# Patient Record
Sex: Female | Born: 1975 | Race: Black or African American | Hispanic: No | Marital: Single | Smoking: Former smoker
Health system: Southern US, Community
[De-identification: ages and names within clinical notes are randomized; demographics above are authoritative.]

## PROBLEM LIST (undated history)

## (undated) DIAGNOSIS — K219 Gastro-esophageal reflux disease without esophagitis: Secondary | ICD-10-CM

## (undated) DIAGNOSIS — I219 Acute myocardial infarction, unspecified: Secondary | ICD-10-CM

## (undated) DIAGNOSIS — I10 Essential (primary) hypertension: Secondary | ICD-10-CM

## (undated) DIAGNOSIS — E669 Obesity, unspecified: Secondary | ICD-10-CM

## (undated) DIAGNOSIS — E785 Hyperlipidemia, unspecified: Secondary | ICD-10-CM

## (undated) DIAGNOSIS — I2510 Atherosclerotic heart disease of native coronary artery without angina pectoris: Secondary | ICD-10-CM

## (undated) HISTORY — DX: Essential (primary) hypertension: I10

## (undated) HISTORY — DX: Hyperlipidemia, unspecified: E78.5

## (undated) HISTORY — DX: Gastro-esophageal reflux disease without esophagitis: K21.9

## (undated) HISTORY — PX: CARDIAC CATHETERIZATION: SHX172

## (undated) HISTORY — DX: Atherosclerotic heart disease of native coronary artery without angina pectoris: I25.10

## (undated) HISTORY — DX: Obesity, unspecified: E66.9

---

## 1998-02-20 ENCOUNTER — Other Ambulatory Visit: Admission: RE | Admit: 1998-02-20 | Discharge: 1998-02-20 | Payer: Self-pay | Admitting: Family Medicine

## 1998-11-01 ENCOUNTER — Encounter: Payer: Self-pay | Admitting: Emergency Medicine

## 1998-11-01 ENCOUNTER — Emergency Department (HOSPITAL_COMMUNITY): Admission: EM | Admit: 1998-11-01 | Discharge: 1998-11-01 | Payer: Self-pay | Admitting: Emergency Medicine

## 2000-06-30 ENCOUNTER — Emergency Department (HOSPITAL_COMMUNITY): Admission: EM | Admit: 2000-06-30 | Discharge: 2000-06-30 | Payer: Self-pay

## 2001-07-29 ENCOUNTER — Emergency Department (HOSPITAL_COMMUNITY): Admission: EM | Admit: 2001-07-29 | Discharge: 2001-07-29 | Payer: Self-pay

## 2002-07-09 ENCOUNTER — Emergency Department (HOSPITAL_COMMUNITY): Admission: EM | Admit: 2002-07-09 | Discharge: 2002-07-10 | Payer: Self-pay | Admitting: Emergency Medicine

## 2002-07-10 ENCOUNTER — Encounter: Payer: Self-pay | Admitting: Emergency Medicine

## 2003-10-25 ENCOUNTER — Emergency Department (HOSPITAL_COMMUNITY): Admission: EM | Admit: 2003-10-25 | Discharge: 2003-10-25 | Payer: Self-pay | Admitting: Emergency Medicine

## 2004-10-23 ENCOUNTER — Emergency Department (HOSPITAL_COMMUNITY): Admission: EM | Admit: 2004-10-23 | Discharge: 2004-10-23 | Payer: Self-pay | Admitting: Emergency Medicine

## 2004-12-10 ENCOUNTER — Ambulatory Visit: Payer: Self-pay | Admitting: Family Medicine

## 2005-01-06 ENCOUNTER — Ambulatory Visit: Payer: Self-pay | Admitting: Family Medicine

## 2005-01-06 ENCOUNTER — Ambulatory Visit: Payer: Self-pay | Admitting: *Deleted

## 2006-05-18 ENCOUNTER — Emergency Department (HOSPITAL_COMMUNITY): Admission: EM | Admit: 2006-05-18 | Discharge: 2006-05-18 | Payer: Self-pay | Admitting: Emergency Medicine

## 2006-05-22 ENCOUNTER — Emergency Department (HOSPITAL_COMMUNITY): Admission: EM | Admit: 2006-05-22 | Discharge: 2006-05-22 | Payer: Self-pay | Admitting: Emergency Medicine

## 2006-06-07 ENCOUNTER — Emergency Department (HOSPITAL_COMMUNITY): Admission: EM | Admit: 2006-06-07 | Discharge: 2006-06-07 | Payer: Self-pay | Admitting: Family Medicine

## 2006-08-10 ENCOUNTER — Emergency Department (HOSPITAL_COMMUNITY): Admission: EM | Admit: 2006-08-10 | Discharge: 2006-08-10 | Payer: Self-pay | Admitting: Emergency Medicine

## 2007-04-19 ENCOUNTER — Emergency Department (HOSPITAL_COMMUNITY): Admission: EM | Admit: 2007-04-19 | Discharge: 2007-04-19 | Payer: Self-pay | Admitting: Family Medicine

## 2008-02-10 ENCOUNTER — Emergency Department (HOSPITAL_COMMUNITY): Admission: EM | Admit: 2008-02-10 | Discharge: 2008-02-10 | Payer: Self-pay | Admitting: Emergency Medicine

## 2008-05-08 ENCOUNTER — Emergency Department (HOSPITAL_COMMUNITY): Admission: EM | Admit: 2008-05-08 | Discharge: 2008-05-08 | Payer: Self-pay | Admitting: Family Medicine

## 2008-06-21 ENCOUNTER — Ambulatory Visit: Payer: Self-pay | Admitting: Cardiology

## 2008-06-21 ENCOUNTER — Inpatient Hospital Stay (HOSPITAL_COMMUNITY): Admission: EM | Admit: 2008-06-21 | Discharge: 2008-06-23 | Payer: Self-pay | Admitting: Emergency Medicine

## 2008-07-06 ENCOUNTER — Ambulatory Visit: Payer: Self-pay | Admitting: Cardiology

## 2008-07-06 ENCOUNTER — Emergency Department (HOSPITAL_COMMUNITY): Admission: EM | Admit: 2008-07-06 | Discharge: 2008-07-06 | Payer: Self-pay | Admitting: Family Medicine

## 2008-07-06 LAB — CONVERTED CEMR LAB
ALT: 10 units/L (ref 0–35)
AST: 13 units/L (ref 0–37)
Albumin: 3.3 g/dL — ABNORMAL LOW (ref 3.5–5.2)
Alkaline Phosphatase: 74 units/L (ref 39–117)
BUN: 3 mg/dL — ABNORMAL LOW (ref 6–23)
Bilirubin, Direct: 0.1 mg/dL (ref 0.0–0.3)
CO2: 29 meq/L (ref 19–32)
Calcium: 9 mg/dL (ref 8.4–10.5)
Chloride: 108 meq/L (ref 96–112)
Cholesterol: 106 mg/dL (ref 0–200)
Creatinine, Ser: 0.6 mg/dL (ref 0.4–1.2)
GFR calc Af Amer: 148 mL/min
GFR calc non Af Amer: 122 mL/min
Glucose, Bld: 73 mg/dL (ref 70–99)
HDL: 27.7 mg/dL — ABNORMAL LOW (ref >39.0)
LDL Cholesterol: 56 mg/dL (ref 0–99)
Potassium: 3.2 meq/L — ABNORMAL LOW (ref 3.5–5.1)
Sodium: 142 meq/L (ref 135–145)
Total Bilirubin: 0.5 mg/dL (ref 0.3–1.2)
Total CHOL/HDL Ratio: 3.8
Total Protein: 7 g/dL (ref 6.0–8.3)
Triglycerides: 113 mg/dL (ref 0–149)
VLDL: 23 mg/dL (ref 0–40)

## 2008-07-14 ENCOUNTER — Ambulatory Visit: Payer: Self-pay | Admitting: Cardiology

## 2008-07-14 LAB — CONVERTED CEMR LAB
BUN: 5 mg/dL — ABNORMAL LOW (ref 6–23)
CO2: 28 meq/L (ref 19–32)
Calcium: 9.4 mg/dL (ref 8.4–10.5)
Chloride: 103 meq/L (ref 96–112)
Creatinine, Ser: 0.7 mg/dL (ref 0.4–1.2)
GFR calc Af Amer: 124 mL/min
GFR calc non Af Amer: 102 mL/min
Glucose, Bld: 76 mg/dL (ref 70–99)
Potassium: 4.1 meq/L (ref 3.5–5.1)
Sodium: 137 meq/L (ref 135–145)

## 2008-08-09 ENCOUNTER — Ambulatory Visit: Payer: Self-pay | Admitting: Cardiology

## 2008-08-09 ENCOUNTER — Emergency Department (HOSPITAL_COMMUNITY): Admission: EM | Admit: 2008-08-09 | Discharge: 2008-08-10 | Payer: Self-pay | Admitting: Emergency Medicine

## 2008-08-16 ENCOUNTER — Ambulatory Visit: Payer: Self-pay | Admitting: Cardiology

## 2008-08-23 ENCOUNTER — Ambulatory Visit: Payer: Self-pay

## 2008-09-01 ENCOUNTER — Ambulatory Visit: Payer: Self-pay | Admitting: Cardiology

## 2008-09-01 LAB — CONVERTED CEMR LAB
BUN: 5 mg/dL — ABNORMAL LOW (ref 6–23)
CO2: 27 meq/L (ref 19–32)
Calcium: 9 mg/dL (ref 8.4–10.5)
Chloride: 105 meq/L (ref 96–112)
Creatinine, Ser: 0.6 mg/dL (ref 0.4–1.2)
GFR calc Af Amer: 148 mL/min
GFR calc non Af Amer: 122 mL/min
Glucose, Bld: 119 mg/dL — ABNORMAL HIGH (ref 70–99)
Potassium: 3.8 meq/L (ref 3.5–5.1)
Sodium: 138 meq/L (ref 135–145)

## 2008-11-08 DIAGNOSIS — R079 Chest pain, unspecified: Secondary | ICD-10-CM | POA: Insufficient documentation

## 2008-11-08 DIAGNOSIS — I10 Essential (primary) hypertension: Secondary | ICD-10-CM | POA: Insufficient documentation

## 2008-11-08 DIAGNOSIS — I2510 Atherosclerotic heart disease of native coronary artery without angina pectoris: Secondary | ICD-10-CM | POA: Insufficient documentation

## 2008-11-08 DIAGNOSIS — K219 Gastro-esophageal reflux disease without esophagitis: Secondary | ICD-10-CM | POA: Insufficient documentation

## 2008-11-08 DIAGNOSIS — E669 Obesity, unspecified: Secondary | ICD-10-CM

## 2008-11-09 ENCOUNTER — Ambulatory Visit: Payer: Self-pay | Admitting: Cardiology

## 2008-11-09 ENCOUNTER — Encounter: Payer: Self-pay | Admitting: Cardiology

## 2008-11-09 DIAGNOSIS — E785 Hyperlipidemia, unspecified: Secondary | ICD-10-CM | POA: Insufficient documentation

## 2009-03-18 ENCOUNTER — Ambulatory Visit: Payer: Self-pay | Admitting: Cardiology

## 2009-03-19 ENCOUNTER — Observation Stay (HOSPITAL_COMMUNITY): Admission: EM | Admit: 2009-03-19 | Discharge: 2009-03-19 | Payer: Self-pay | Admitting: Emergency Medicine

## 2009-03-21 ENCOUNTER — Telehealth (INDEPENDENT_AMBULATORY_CARE_PROVIDER_SITE_OTHER): Payer: Self-pay | Admitting: *Deleted

## 2009-03-22 ENCOUNTER — Ambulatory Visit: Payer: Self-pay

## 2009-03-22 ENCOUNTER — Encounter: Payer: Self-pay | Admitting: Cardiovascular Disease

## 2009-04-06 ENCOUNTER — Ambulatory Visit: Payer: Self-pay | Admitting: Cardiology

## 2009-04-06 DIAGNOSIS — F17200 Nicotine dependence, unspecified, uncomplicated: Secondary | ICD-10-CM | POA: Insufficient documentation

## 2009-07-12 ENCOUNTER — Emergency Department (HOSPITAL_COMMUNITY): Admission: EM | Admit: 2009-07-12 | Discharge: 2009-07-12 | Payer: Self-pay | Admitting: Emergency Medicine

## 2009-07-26 ENCOUNTER — Emergency Department (HOSPITAL_COMMUNITY): Admission: EM | Admit: 2009-07-26 | Discharge: 2009-07-26 | Payer: Self-pay | Admitting: Emergency Medicine

## 2009-08-06 ENCOUNTER — Emergency Department (HOSPITAL_COMMUNITY): Admission: EM | Admit: 2009-08-06 | Discharge: 2009-08-06 | Payer: Self-pay | Admitting: Emergency Medicine

## 2009-11-19 ENCOUNTER — Telehealth (INDEPENDENT_AMBULATORY_CARE_PROVIDER_SITE_OTHER): Payer: Self-pay | Admitting: *Deleted

## 2009-11-24 ENCOUNTER — Emergency Department (HOSPITAL_COMMUNITY): Admission: EM | Admit: 2009-11-24 | Discharge: 2009-11-24 | Payer: Self-pay | Admitting: Emergency Medicine

## 2009-12-11 ENCOUNTER — Ambulatory Visit: Payer: Self-pay | Admitting: Cardiology

## 2010-01-01 ENCOUNTER — Ambulatory Visit: Payer: Self-pay | Admitting: Cardiology

## 2010-01-01 ENCOUNTER — Telehealth: Payer: Self-pay | Admitting: Cardiology

## 2010-01-08 LAB — CONVERTED CEMR LAB
ALT: 17 U/L
AST: 18 U/L
Albumin: 3.9 g/dL
Alkaline Phosphatase: 63 U/L
BUN: 10 mg/dL
Basophils Absolute: 0 K/uL
Basophils Relative: 0.1 %
Bilirubin, Direct: 0.1 mg/dL
CO2: 26 meq/L
Calcium: 9.3 mg/dL
Chloride: 104 meq/L
Cholesterol: 161 mg/dL
Creatinine, Ser: 0.7 mg/dL
Eosinophils Absolute: 0.1 K/uL
Eosinophils Relative: 0.9 %
GFR calc non Af Amer: 122.43 mL/min
Glucose, Bld: 76 mg/dL
HCT: 34.6 % — ABNORMAL LOW
HDL: 47.9 mg/dL
Hemoglobin: 12.1 g/dL
LDL Cholesterol: 90 mg/dL
Lymphocytes Relative: 26.8 %
Lymphs Abs: 3.3 K/uL
MCHC: 35 g/dL
MCV: 95.4 fL
Monocytes Absolute: 0.8 K/uL
Monocytes Relative: 6.6 %
Neutro Abs: 8.1 K/uL — ABNORMAL HIGH
Neutrophils Relative %: 65.6 %
Platelets: 407 K/uL — ABNORMAL HIGH
Potassium: 3.6 meq/L
RBC: 3.62 M/uL — ABNORMAL LOW
RDW: 12.4 %
Sodium: 139 meq/L
Total Bilirubin: 0.4 mg/dL
Total CHOL/HDL Ratio: 3
Total Protein: 6.9 g/dL
Triglycerides: 114 mg/dL
VLDL: 22.8 mg/dL
WBC: 12.3 10*3/microliter — ABNORMAL HIGH

## 2010-01-28 ENCOUNTER — Emergency Department (HOSPITAL_COMMUNITY): Admission: EM | Admit: 2010-01-28 | Discharge: 2010-01-28 | Payer: Self-pay | Admitting: Emergency Medicine

## 2010-02-16 ENCOUNTER — Emergency Department (HOSPITAL_COMMUNITY): Admission: EM | Admit: 2010-02-16 | Discharge: 2010-02-16 | Payer: Self-pay | Admitting: Emergency Medicine

## 2010-04-05 ENCOUNTER — Encounter (INDEPENDENT_AMBULATORY_CARE_PROVIDER_SITE_OTHER): Payer: Self-pay | Admitting: *Deleted

## 2010-04-05 ENCOUNTER — Telehealth: Payer: Self-pay | Admitting: Cardiology

## 2010-04-15 ENCOUNTER — Telehealth: Payer: Self-pay | Admitting: Cardiology

## 2010-04-22 ENCOUNTER — Telehealth: Payer: Self-pay | Admitting: Cardiology

## 2010-05-21 ENCOUNTER — Telehealth: Payer: Self-pay | Admitting: Cardiology

## 2010-05-21 ENCOUNTER — Encounter (INDEPENDENT_AMBULATORY_CARE_PROVIDER_SITE_OTHER): Payer: Self-pay | Admitting: *Deleted

## 2010-08-01 ENCOUNTER — Emergency Department (HOSPITAL_COMMUNITY)
Admission: EM | Admit: 2010-08-01 | Discharge: 2010-08-01 | Payer: Self-pay | Source: Home / Self Care | Admitting: Emergency Medicine

## 2010-08-16 ENCOUNTER — Emergency Department (HOSPITAL_COMMUNITY)
Admission: EM | Admit: 2010-08-16 | Discharge: 2010-08-16 | Payer: Self-pay | Source: Home / Self Care | Admitting: Emergency Medicine

## 2010-08-19 LAB — DIFFERENTIAL
Basophils Absolute: 0 10*3/uL (ref 0.0–0.1)
Basophils Relative: 0 % (ref 0–1)
Eosinophils Absolute: 0.1 10*3/uL (ref 0.0–0.7)
Eosinophils Relative: 1 % (ref 0–5)
Lymphocytes Relative: 21 % (ref 12–46)
Lymphs Abs: 2.6 10*3/uL (ref 0.7–4.0)
Monocytes Absolute: 0.5 10*3/uL (ref 0.1–1.0)
Monocytes Relative: 4 % (ref 3–12)
Neutro Abs: 9.2 10*3/uL — ABNORMAL HIGH (ref 1.7–7.7)
Neutrophils Relative %: 74 % (ref 43–77)

## 2010-08-19 LAB — CBC
HCT: 38.6 % (ref 36.0–46.0)
Hemoglobin: 12.9 g/dL (ref 12.0–15.0)
MCH: 31.9 pg (ref 26.0–34.0)
MCHC: 33.4 g/dL (ref 30.0–36.0)
MCV: 95.5 fL (ref 78.0–100.0)
Platelets: 359 10*3/uL (ref 150–400)
RBC: 4.04 MIL/uL (ref 3.87–5.11)
RDW: 12.8 % (ref 11.5–15.5)
WBC: 12.3 10*3/uL — ABNORMAL HIGH (ref 4.0–10.5)

## 2010-08-19 LAB — POCT CARDIAC MARKERS
CKMB, poc: <1 ng/mL — ABNORMAL LOW (ref 1.0–8.0)
Myoglobin, poc: 58.3 ng/mL (ref 12–200)
Troponin i, poc: <0.05 ng/mL (ref 0.00–0.09)

## 2010-08-19 LAB — BASIC METABOLIC PANEL
BUN: 7 mg/dL (ref 6–23)
CO2: 21 mEq/L (ref 19–32)
Calcium: 9.2 mg/dL (ref 8.4–10.5)
Chloride: 109 mEq/L (ref 96–112)
Creatinine, Ser: 0.76 mg/dL (ref 0.4–1.2)
GFR calc Af Amer: >60 mL/min (ref >60)
GFR calc non Af Amer: >60 mL/min (ref >60)
Glucose, Bld: 140 mg/dL — ABNORMAL HIGH (ref 70–99)
Potassium: 4.4 mEq/L (ref 3.5–5.1)
Sodium: 140 mEq/L (ref 135–145)

## 2010-08-19 LAB — URINALYSIS, ROUTINE W REFLEX MICROSCOPIC
Bilirubin Urine: NEGATIVE
Hgb urine dipstick: NEGATIVE
Ketones, ur: NEGATIVE mg/dL
Nitrite: NEGATIVE
Protein, ur: NEGATIVE mg/dL
Specific Gravity, Urine: 1.005 (ref 1.005–1.030)
Urine Glucose, Fasting: NEGATIVE mg/dL
Urobilinogen, UA: 0.2 mg/dL (ref 0.0–1.0)
pH: 6.5 (ref 5.0–8.0)

## 2010-08-19 LAB — OCCULT BLOOD, POC DEVICE: Fecal Occult Bld: NEGATIVE

## 2010-08-19 LAB — ACETAMINOPHEN LEVEL: Acetaminophen (Tylenol), Serum: <10 ug/mL — ABNORMAL LOW (ref 10–30)

## 2010-08-19 LAB — GLUCOSE, CAPILLARY: Glucose-Capillary: 137 mg/dL — ABNORMAL HIGH (ref 70–99)

## 2010-08-19 LAB — POCT PREGNANCY, URINE: Preg Test, Ur: NEGATIVE

## 2010-08-19 LAB — SALICYLATE LEVEL: Salicylate Lvl: <4 mg/dL (ref 2.8–20.0)

## 2010-09-03 NOTE — Assessment & Plan Note (Signed)
Summary: [id number]   Visit Type:  6 mo f/u Primary Provider:  DYork Ram CC:  sob...denies any cp or edema.  History of Present Illness: The patient is 35years old and return for management of CAD. In November of 2009 she had a non-ST elevation MI treated with drug-eluting stents to the circumflex and right coronary arteries by Dr. 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 She was evaluated for chest pain in August of 2010 with a Myoview which showed inferior ischemia which was felt to be due to her total occlusion of the distal circumflex artery. She has done quite well since that time has had no recent chest pain shortness of breath or palpitations.  Further problems include hypertension and excess weight. She used to be a smoker but she has now stopped smoking.  Her biggest problem is related to low back pain and lumbar disc disease. She is being treated by her primary care physician for this.  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  She came in today with her partner.  Current Medications (verified): 1)  Aspirin Ec 325 Mg Tbec (Aspirin) .... Take One Tablet By Mouth Daily 2)  Ventolin Hfa 108 (90 Base) Mcg/act Aers (Albuterol Sulfate) .... As Needed 3)  Simvastatin 40 Mg Tabs (Simvastatin) .... Take One Tablet By Mouth Daily At Bedtime 4)  Plavix 75 Mg Tabs (Clopidogrel Bisulfate) .... Take One Tablet By Mouth Daily 5)  Multivitamins  Caps (Multiple Vitamin) .... Take 1 By Mouth Once Daily 6)  Amlodipine Besylate 10 Mg Tabs (Amlodipine Besylate) .... Take One Tablet By Mouth Daily 7)  Lisinopril 40 Mg Tabs (Lisinopril) ..Marland Kitchen. 1 Tablet By Mouth Once A Day 8)  Metoprolol Tartrate 50 Mg Tabs (Metoprolol Tartrate) .... Take One Tablet By Mouth Twice A Day  Allergies (verified): No Known Drug Allergies  Past History:  Past Medical History: Reviewed history from 11/08/2008 and no changes required.  1. Coronary artery disease and NSTEMI 2009 (status post PCI with overlapping 2.5 x 18       mm and 2.75 x 12 mm Promus drug-eluting stent to the OM 1; and a       3.5 x 28 mm Promus to the mid RCA).   2. Hypertension.   3. GERD.   4. Obesity.   Review of Systems       ROS is negative except as outlined in HPI.   Vital Signs:  Patient profile:   35year old female Height:      62 inches Weight:      218 pounds BMI:     40.02 Pulse rate:   69 / minute Pulse rhythm:   regular BP sitting:   122 / 80  (left arm) Cuff size:   regular  Vitals Entered By: DLubertha Basque CNA (Dec 11, 2009 4:33 PM)  Physical Exam  Additional Exam:  Gen. Well-nourished, in no distress   Neck: No JVD, thyroid not enlarged, no carotid bruits Lungs: No tachypnea, clear without rales, rhonchi or wheezes Cardiovascular: Rhythm regular, PMI not displaced,  heart sounds  normal, no murmurs or gallops, no peripheral edema, pulses normal in all 4 extremities. Abdomen: BS normal, abdomen soft and non-tender without masses or organomegaly, no hepatosplenomegaly. MS: No deformities, no cyanosis or clubbing   Neuro:  No focal sns   Skin:  no lesions    Impression & Recommendations:  Problem #  1:  CAD, UNSPECIFIED SITE (ICD-414.00)  She had a previous non-ST elevation MI and PCI procedure as described in the history of present illness. She's had no recent chest pain and this problem appears stable. Her updated medication list for this problem includes:    Aspirin Ec 325 Mg Tbec (Aspirin) .Marland Kitchen... Take one tablet by mouth daily    Plavix 75 Mg Tabs (Clopidogrel bisulfate) .Marland Kitchen... Take one tablet by mouth daily    Amlodipine Besylate 10 Mg Tabs (Amlodipine besylate) .Marland Kitchen... Take one tablet by mouth daily    Lisinopril 40 Mg Tabs (Lisinopril) .Marland Kitchen... 1 tablet by mouth once a day    Metoprolol Tartrate 50 Mg Tabs (Metoprolol tartrate) .Marland Kitchen... Take one tablet by mouth twice a day  Orders: EKG w/ Interpretation (93000)  Her  updated medication list for this problem includes:    Aspirin Ec 325 Mg Tbec (Aspirin) .Marland Kitchen... Take one tablet by mouth daily    Plavix 75 Mg Tabs (Clopidogrel bisulfate) .Marland Kitchen... Take one tablet by mouth daily    Amlodipine Besylate 10 Mg Tabs (Amlodipine besylate) .Marland Kitchen... Take one tablet by mouth daily    Lisinopril 40 Mg Tabs (Lisinopril) .Marland Kitchen... 1 tablet by mouth once a day    Metoprolol Tartrate 50 Mg Tabs (Metoprolol tartrate) .Marland Kitchen... Take one tablet by mouth twice a day  Problem # 2:  HYPERLIPIDEMIA-MIXED (ICD-272.4)  This is managed with simvastatin. She has not had a lipid profile in more than a year and we will plan to get that. Her updated medication list for this problem includes:    Simvastatin 40 Mg Tabs (Simvastatin) .Marland Kitchen... Take one tablet by mouth daily at bedtime  Her updated medication list for this problem includes:    Simvastatin 40 Mg Tabs (Simvastatin) .Marland Kitchen... Take one tablet by mouth daily at bedtime  Problem # 3:  HYPERTENSION, UNSPECIFIED (ICD-401.9)  This is well controlled on current medications. Her updated medication list for this problem includes:    Aspirin Ec 325 Mg Tbec (Aspirin) .Marland Kitchen... Take one tablet by mouth daily    Amlodipine Besylate 10 Mg Tabs (Amlodipine besylate) .Marland Kitchen... Take one tablet by mouth daily    Lisinopril 40 Mg Tabs (Lisinopril) .Marland Kitchen... 1 tablet by mouth once a day    Metoprolol Tartrate 50 Mg Tabs (Metoprolol tartrate) .Marland Kitchen... Take one tablet by mouth twice a day  Her updated medication list for this problem includes:    Aspirin Ec 325 Mg Tbec (Aspirin) .Marland Kitchen... Take one tablet by mouth daily    Amlodipine Besylate 10 Mg Tabs (Amlodipine besylate) .Marland Kitchen... Take one tablet by mouth daily    Lisinopril 40 Mg Tabs (Lisinopril) .Marland Kitchen... 1 tablet by mouth once a day    Metoprolol Tartrate 50 Mg Tabs (Metoprolol tartrate) .Marland Kitchen... Take one tablet by mouth twice a day  Problem # 4:  TOBACCO ABUSE (ICD-305.1) She is no longer smoking. She has gained weight because of this  and I encouraged her to stop that.  Patient Instructions: 1)  Your physician recommends that you schedule a follow-up appointment in: [place] 2)  Your physician recommends that you return for a FASTING lipid profile and CBC,BMP,LIVER 414.01, 272.2 3)  Your physician recommends that you continue on your current medications as directed. Please refer to the Current Medication list given to you today. 4)  Your physician recommends a low cholesterol, low fat diet. Please see [facility] handout.

## 2010-09-03 NOTE — Letter (Signed)
Summary: Generic Letter  Press photographer, [place]  [address]   [place], [place] [address]   Phone: [phone]  Fax: [phone]        May 21, 2010 MRN: [id number]    [place], [place]  [address]    To Whom It May Concern,  The above named patient is currently followed in our cardiology practice for cornary artery disease. Her medications include- aspirin 348m once daily, simvastatin 222monce daily, plavix 7561mnce daily, amlodipine 24m73mce daily, lisinopril 40mg33me daily, and metoprolol tartrate 50mg 100mtimes a day. Due to her cardiac history, she will need to remain on these medications indefinitely.      Sincerely,   Bruce Eustace QuaileatheAlvis LemmingsBSN

## 2010-09-03 NOTE — Progress Notes (Signed)
Summary: Need note for work  Phone Note Call from Patient Call back at [employer] [phone]   Caller: Patient Summary of Call: Pt calling to get a notesfor work Initial call taken by: Delsa Sale,  Jan 01, 2010 3:00 PM  Follow-up for Phone Call        needs note stating she was here today, she will pick it up.

## 2010-09-03 NOTE — Progress Notes (Signed)
Summary: refill** [facility] [address]**  Phone Note Refill Request   Refills Requested: Medication #1:  PLAVIX 75 MG TABS Take one tablet by mouth daily  Medication #2:  LISINOPRIL 40 MG TABS 1 tablet by mouth once a day  Medication #3:  AMLODIPINE BESYLATE 10 MG TABS Take one tablet by mouth daily  Medication #4:  METOPROLOL TARTRATE 50 MG TABS Take one tablet by mouth twice a day. [facility] [address]   Method Requested: Telephone to Pharmacy Initial call taken by: Darnell Level,  November 19, 2009 9:04 AM  Follow-up for Phone Call        Rx faxed to [facility] Follow-up by: Lynden Ang,  November 19, 2009 3:37 PM    Prescriptions: METOPROLOL TARTRATE 50 MG TABS (METOPROLOL TARTRATE) Take one tablet by mouth twice a day  #60 Each x 5   Entered by:   Lynden Ang   Authorized by:   Fatima Sanger, MD, [facility]   Signed by:   Lynden Ang on 11/19/2009   Method used:   Electronically to        [facility] (retail)       [phone]       [place], [place]  [address]       Ph: [phone]       Fax: [phone]   RxID:   [id number] LISINOPRIL 40 MG TABS (LISINOPRIL) 1 tablet by mouth once a day  #30 x 6   Entered by:   Lynden Ang   Authorized by:   Fatima Sanger, MD, [facility]   Signed by:   Lynden Ang on 11/19/2009   Method used:   Electronically to        [facility] (retail)       [phone]       [place], [place]  [address]       Ph: [phone]       Fax: [phone]   RxID:   [id number] AMLODIPINE BESYLATE 10 MG TABS (AMLODIPINE BESYLATE) Take one tablet by mouth daily  #30 x 6   Entered by:   Lynden Ang   Authorized by:   Fatima Sanger, MD, [facility]   Signed by:   Lynden Ang on 11/19/2009   Method used:   Electronically to        [facility] (retail)     [phone]       [place], [place]  [address]       Ph: [phone]       Fax: [phone]   RxID:   [id number] PLAVIX 75 MG TABS (CLOPIDOGREL BISULFATE) Take one tablet by mouth daily  #30 x 6   Entered by:   Lynden Ang   Authorized by:   Fatima Sanger, MD, [facility]   Signed by:   Lynden Ang on 11/19/2009   Method used:   Electronically to        [facility] (retail)       [phone]       [place], [place]  [address]       Ph: [phone]       Fax: [phone]   RxID:   [id number]

## 2010-09-03 NOTE — Letter (Signed)
Summary: Generic Letter  Press photographer, [place]  [address] [place]   [place], [place] [address]   Phone: [phone]  Fax: [phone]        April 05, 2010 MRN: [id number]    [place],   [address]    Dear Ms. Rabadi,  I have tried to reach you by phone, but the home number we have listed for you has stated it was invalid. I was needing to speak with you in regards to new FDA guidelines that are out regarding simvastatin and a possible interaction with amlodipine. Please call me as soon as you receive this letter, at [phone].     Sincerely,  Alvis Lemmings, RN, BSN  This letter has been electronically signed by your physician.

## 2010-09-03 NOTE — Progress Notes (Signed)
Summary: requesting  generics  Phone Note Call from Patient   Caller: Patient Reason for Call: Talk to Nurse Summary of Call: plavix and amlodipine have gone up-pt requesting generics-[facility] [address] # [phone] Initial call taken by: Lorenda Hatchet,  April 22, 2010 1:34 PM  Follow-up for Phone Call        notified pt that plavix not generic. ordered meds Joan Flores, RN, BSN    New/Updated Medications: PLAVIX 75 MG TABS (CLOPIDOGREL BISULFATE) Take one tablet by mouth daily AMLODIPINE BESYLATE 10 MG TABS (AMLODIPINE BESYLATE) once daily Prescriptions: PLAVIX 75 MG TABS (CLOPIDOGREL BISULFATE) Take one tablet by mouth daily  #30 x 11   Entered by:   Joan Flores RN   Authorized by:   Fatima Sanger, MD, [facility]   Signed by:   Joan Flores RN on 04/22/2010   Method used:   Electronically to        [facility] (retail)       [phone]       [place], [place]  [address]       Ph: [phone]       Fax: [phone]   RxID:   [id number] AMLODIPINE BESYLATE 10 MG TABS (AMLODIPINE BESYLATE) once daily  #30 x 11   Entered by:   Joan Flores RN   Authorized by:   Fatima Sanger, MD, [facility]   Signed by:   Joan Flores RN on 04/22/2010   Method used:   Electronically to        [facility].* (retail)       [phone]       [place], [place]  [address]       Ph: [phone]       Fax: [phone]   RxID:   [id number]

## 2010-09-03 NOTE — Progress Notes (Signed)
Summary: returning call from letter  Phone Note Call from Patient   Caller: Patient Reason for Call: Talk to Nurse Summary of Call: pt returning call to [facility] from letter she sent re med-pls call [phone] Initial call taken by: Lorenda Hatchet,  April 15, 2010 2:10 PM  Follow-up for Phone Call        pt has been out of her medications for 3 weeks.  We will call in a new Rx for Simvastation 65m at bedtime.  She verbalized understanding.  Her new # is [phone] KJanan Halter RN, BSN  April 15, 2010 2:54 PM    New/Updated Medications: SIMVASTATIN 20 MG TABS (SIMVASTATIN) one by mouth qhs Prescriptions: SIMVASTATIN 20 MG TABS (SIMVASTATIN) one by mouth qhs  #30 x 6   Entered by:   KJanan Halter RN, BSN   Authorized by:   BFatima Sanger MD, [facility]  Signed by:   KJanan Halter RN, BSN on 04/15/2010   Method used:   Electronically to        [facility]* (retail)       [phone]       [place]  [address]      Ph: [phone]      Fax: [phone]  RxID:   [id number]

## 2010-09-03 NOTE — Letter (Signed)
Summary: Work Herbalist, [place]  [address] [place]   [place],  [address]   Phone: [phone]  Fax: [phone]         Jan 01, 2010      [facility]     The above named patient had a medical visit today.  Please take this into consideration when reviewing the time away from work/school.        Sincerely yours,     Vic Ripper, RN for Dr. Eustace Quail [facility]

## 2010-09-03 NOTE — Progress Notes (Signed)
Summary: needs letter   Phone Note Call from Patient   Caller: Patient Reason for Call: Talk to Nurse Summary of Call: need a list of meds, with a letter stating she will be on these indefinitely-pls call when ready [phone] will pick up, trying to get assistance wit her meds Initial call taken by: Lorenda Hatchet,  May 21, 2010 10:28 AM  Follow-up for Phone Call        I will review with Dr. Olevia Perches and ask that he do a letter. Alvis Lemmings, RN, BSN  May 21, 2010 11:14 AM   Letter done. Patient aware.  Follow-up by: Alvis Lemmings, RN, BSN,  May 21, 2010 6:00 PM

## 2010-09-03 NOTE — Progress Notes (Signed)
Summary: Med change  ---- Converted from flag ---- ---- 04/01/2010 8:47 PM, Fatima Sanger, MD, [facility] wrote: Change to 20. BB  ---- 03/28/2010 5:19 PM, Alvis Lemmings, RN, BSN wrote: notification from [facility]- pt on simvastatin 72m and amlodipine. Max on simvastatin should be 262mwith amlodipine. Please advise. Thanks. ------------------------------  Phone Note Outgoing Call   Call placed by: HeAlvis LemmingsRN, BSN,  April 05, 2010 2:00 PM Call placed to: Patient Summary of Call: I attempted to call the pt at her home #- stated # was invalid. I attempted to call her work #- voice mail was identified as another person. Letter mailed to pt asking her to call so we can decrease simvastatin due to interaction with amlodipine.  Initial call taken by: HeAlvis LemmingsRN, BSN,  April 05, 2010 2:01 PM

## 2010-09-21 IMAGING — CR DG CHEST 2V
2 series · 2 of 2 positions shown · non-contrast
Comparison: Chest 07/12/2009.

CLINICAL DATA: Chest pain shortness of breath.

CHEST - 2 VIEW

[w chest pa]
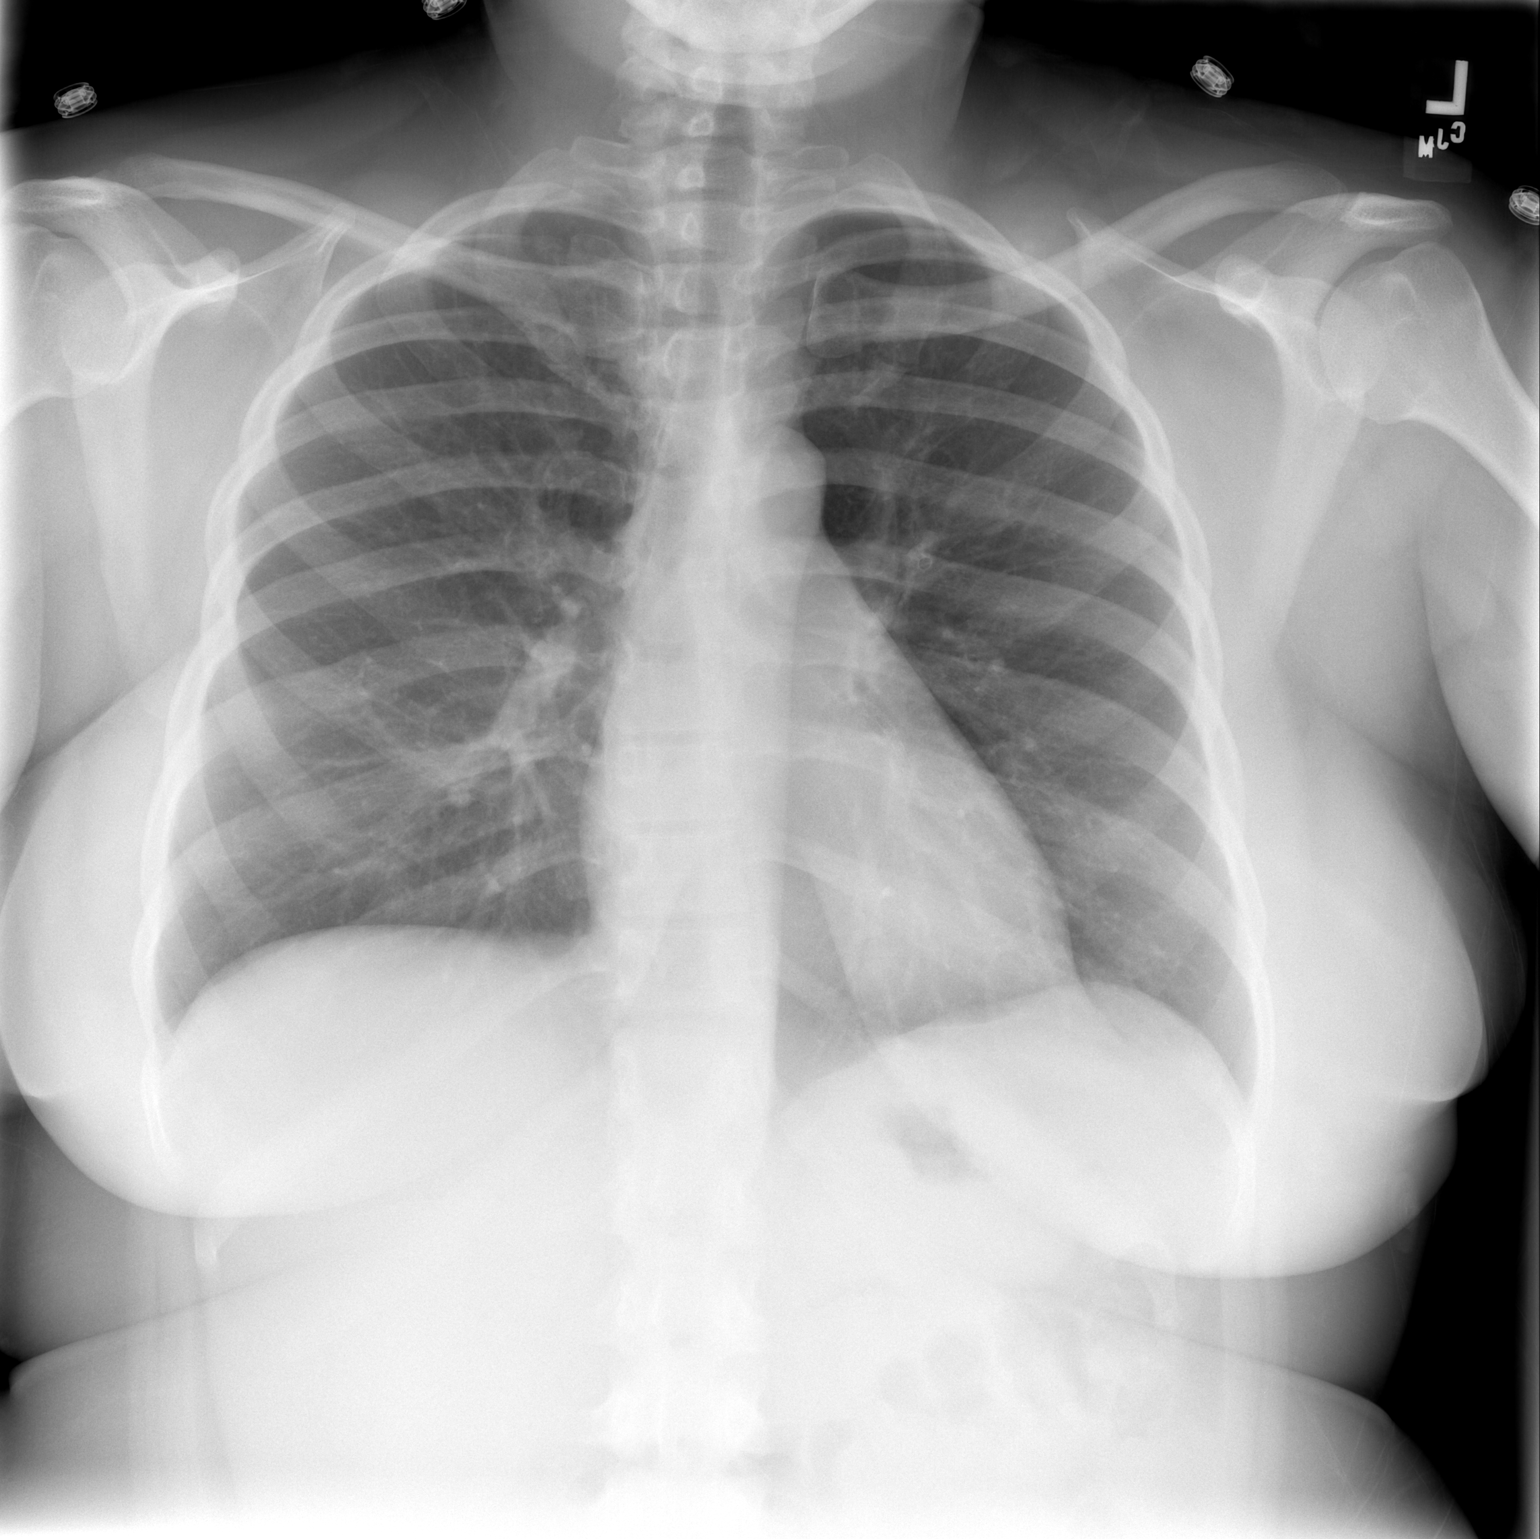

[w chest lat]
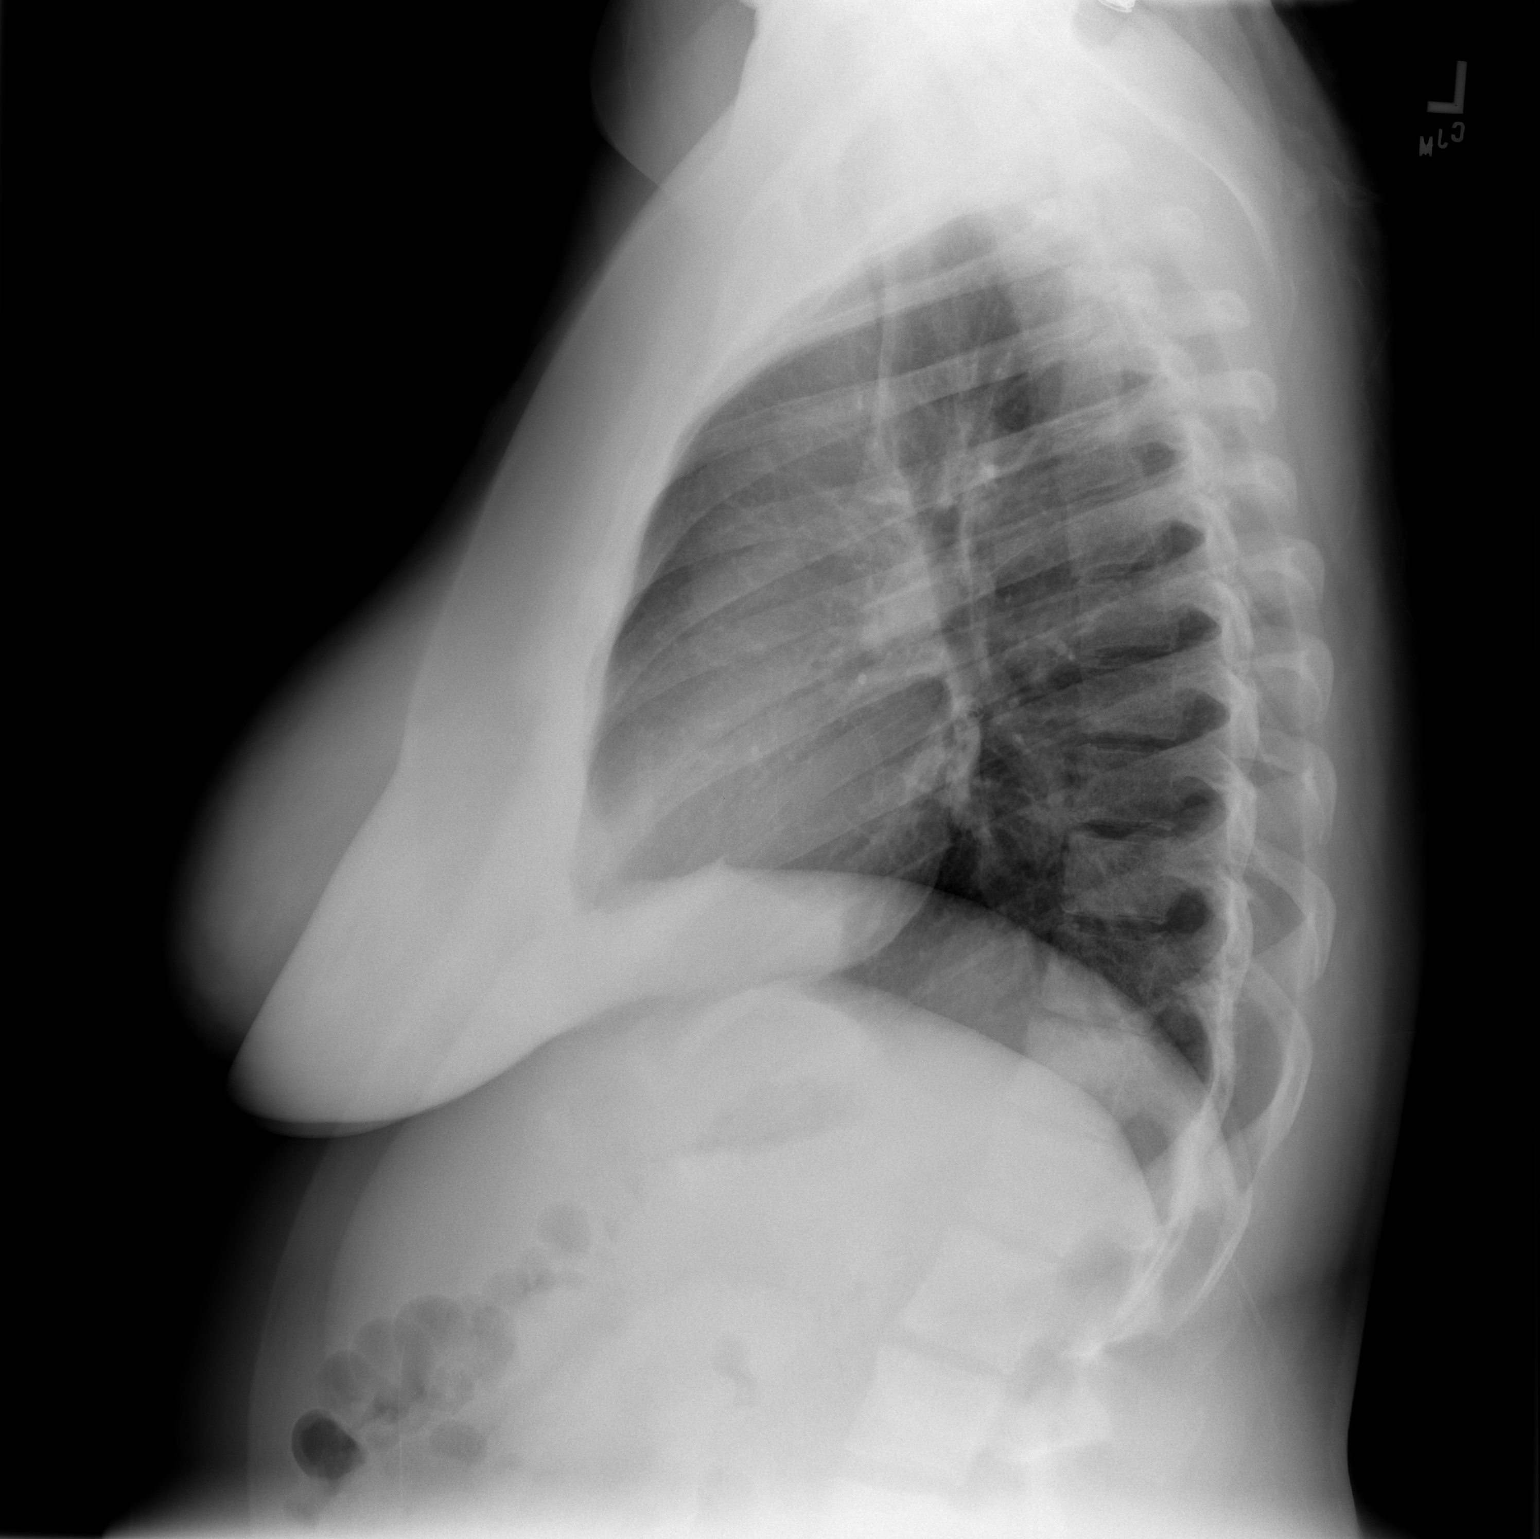

[2 of 2 positions shown; findings below may reference images not displayed]

FINDINGS: Lungs clear.  Heart size normal.  No pleural effusion or
focal bony abnormality.
IMPRESSION: Negative chest.

## 2010-10-20 LAB — BASIC METABOLIC PANEL
BUN: 8 mg/dL (ref 6–23)
CO2: 25 mEq/L (ref 19–32)
Calcium: 9.1 mg/dL (ref 8.4–10.5)
Chloride: 103 mEq/L (ref 96–112)
Creatinine, Ser: 0.69 mg/dL (ref 0.4–1.2)
GFR calc Af Amer: >60 mL/min (ref >60)
GFR calc non Af Amer: >60 mL/min (ref >60)
Glucose, Bld: 87 mg/dL (ref 70–99)
Potassium: 3.6 mEq/L (ref 3.5–5.1)
Sodium: 136 mEq/L (ref 135–145)

## 2010-10-20 LAB — DIFFERENTIAL
Basophils Absolute: 0 10*3/uL (ref 0.0–0.1)
Basophils Relative: 0 % (ref 0–1)
Eosinophils Absolute: 0.1 10*3/uL (ref 0.0–0.7)
Eosinophils Relative: 1 % (ref 0–5)
Lymphocytes Relative: 26 % (ref 12–46)
Lymphs Abs: 3.4 10*3/uL (ref 0.7–4.0)
Monocytes Absolute: 0.9 10*3/uL (ref 0.1–1.0)
Monocytes Relative: 7 % (ref 3–12)
Neutro Abs: 8.4 10*3/uL — ABNORMAL HIGH (ref 1.7–7.7)
Neutrophils Relative %: 66 % (ref 43–77)

## 2010-10-20 LAB — CBC
HCT: 37.7 % (ref 36.0–46.0)
Hemoglobin: 13 g/dL (ref 12.0–15.0)
MCHC: 34.4 g/dL (ref 30.0–36.0)
MCV: 97.2 fL (ref 78.0–100.0)
Platelets: 389 10*3/uL (ref 150–400)
RBC: 3.87 MIL/uL (ref 3.87–5.11)
RDW: 12.2 % (ref 11.5–15.5)
WBC: 12.8 10*3/uL — ABNORMAL HIGH (ref 4.0–10.5)

## 2010-10-20 LAB — POCT CARDIAC MARKERS
CKMB, poc: 1.3 ng/mL (ref 1.0–8.0)
CKMB, poc: 1.4 ng/mL (ref 1.0–8.0)
Myoglobin, poc: 42.4 ng/mL (ref 12–200)
Myoglobin, poc: 49.1 ng/mL (ref 12–200)
Troponin i, poc: <0.05 ng/mL (ref 0.00–0.09)
Troponin i, poc: <0.05 ng/mL (ref 0.00–0.09)

## 2010-10-23 ENCOUNTER — Other Ambulatory Visit: Payer: Self-pay | Admitting: *Deleted

## 2010-10-23 DIAGNOSIS — I10 Essential (primary) hypertension: Secondary | ICD-10-CM

## 2010-10-23 MED ORDER — LISINOPRIL 40 MG PO TABS: 40.0000 mg | ORAL_TABLET | Freq: Every day | ORAL | Status: DC

## 2010-11-04 LAB — D-DIMER, QUANTITATIVE: D-Dimer, Quant: 0.37 ug/mL-FEU (ref 0.00–0.48)

## 2010-11-04 LAB — DIFFERENTIAL
Basophils Absolute: 0 10*3/uL (ref 0.0–0.1)
Basophils Relative: 0 % (ref 0–1)
Eosinophils Relative: 2 % (ref 0–5)
Lymphocytes Relative: 28 % (ref 12–46)
Monocytes Absolute: 0.7 10*3/uL (ref 0.1–1.0)
Neutro Abs: 6.7 10*3/uL (ref 1.7–7.7)

## 2010-11-04 LAB — BASIC METABOLIC PANEL
BUN: 6 mg/dL (ref 6–23)
CO2: 26 mEq/L (ref 19–32)
Calcium: 9.2 mg/dL (ref 8.4–10.5)
GFR calc non Af Amer: >60 mL/min (ref >60)
Glucose, Bld: 94 mg/dL (ref 70–99)

## 2010-11-04 LAB — POCT CARDIAC MARKERS
CKMB, poc: 1.2 ng/mL (ref 1.0–8.0)
CKMB, poc: 1.2 ng/mL (ref 1.0–8.0)
Myoglobin, poc: 46.5 ng/mL (ref 12–200)

## 2010-11-04 LAB — CBC
HCT: 37 % (ref 36.0–46.0)
Hemoglobin: 12.7 g/dL (ref 12.0–15.0)
MCHC: 34.5 g/dL (ref 30.0–36.0)
Platelets: 400 10*3/uL (ref 150–400)
RDW: 12.3 % (ref 11.5–15.5)

## 2010-11-05 LAB — POCT CARDIAC MARKERS: Myoglobin, poc: 54.2 ng/mL (ref 12–200)

## 2010-11-05 LAB — URINALYSIS, ROUTINE W REFLEX MICROSCOPIC
Nitrite: NEGATIVE
Protein, ur: NEGATIVE mg/dL
Specific Gravity, Urine: 1.007 (ref 1.005–1.030)
Urobilinogen, UA: 0.2 mg/dL (ref 0.0–1.0)

## 2010-11-09 LAB — DIFFERENTIAL
Basophils Absolute: 0 10*3/uL (ref 0.0–0.1)
Basophils Absolute: 0 10*3/uL (ref 0.0–0.1)
Basophils Relative: 0 % (ref 0–1)
Basophils Relative: 0 % (ref 0–1)
Eosinophils Absolute: 0.2 10*3/uL (ref 0.0–0.7)
Eosinophils Relative: 2 % (ref 0–5)
Eosinophils Relative: 2 % (ref 0–5)
Monocytes Absolute: 0.6 10*3/uL (ref 0.1–1.0)
Neutro Abs: 6.3 10*3/uL (ref 1.7–7.7)

## 2010-11-09 LAB — POCT I-STAT, CHEM 8
BUN: 8 mg/dL (ref 6–23)
Calcium, Ion: 1.2 mmol/L (ref 1.12–1.32)
Chloride: 103 meq/L (ref 96–112)
Creatinine, Ser: 0.8 mg/dL (ref 0.4–1.2)
Glucose, Bld: 134 mg/dL — ABNORMAL HIGH (ref 70–99)
HCT: 39 % (ref 36.0–46.0)
Hemoglobin: 13.3 g/dL (ref 12.0–15.0)
Potassium: 3.4 meq/L — ABNORMAL LOW (ref 3.5–5.1)
Sodium: 141 mEq/L (ref 135–145)
TCO2: 25 mmol/L (ref 0–100)

## 2010-11-09 LAB — CBC
HCT: 35.2 % — ABNORMAL LOW (ref 36.0–46.0)
HCT: 37.4 % (ref 36.0–46.0)
Hemoglobin: 12 g/dL (ref 12.0–15.0)
MCV: 99.6 fL (ref 78.0–100.0)
MCV: 97.1 fL (ref 78.0–100.0)
Platelets: 332 10*3/uL (ref 150–400)
Platelets: 366 10*3/uL (ref 150–400)
RBC: 3.53 MIL/uL — ABNORMAL LOW (ref 3.87–5.11)
RDW: 12.6 % (ref 11.5–15.5)
WBC: 11.5 10*3/uL — ABNORMAL HIGH (ref 4.0–10.5)

## 2010-11-09 LAB — LIPID PANEL
LDL Cholesterol: 79 mg/dL (ref 0–99)
Triglycerides: 106 mg/dL (ref <150)

## 2010-11-09 LAB — CARDIAC PANEL(CRET KIN+CKTOT+MB+TROPI)
CK, MB: 1.3 ng/mL (ref 0.3–4.0)
Relative Index: 1.1 (ref 0.0–2.5)
Relative Index: 1.5 (ref 0.0–2.5)
Troponin I: 0.02 ng/mL (ref 0.00–0.06)

## 2010-11-09 LAB — COMPREHENSIVE METABOLIC PANEL
Albumin: 3.3 g/dL — ABNORMAL LOW (ref 3.5–5.2)
Alkaline Phosphatase: 68 U/L (ref 39–117)
BUN: 6 mg/dL (ref 6–23)
CO2: 24 mEq/L (ref 19–32)
Chloride: 106 mEq/L (ref 96–112)
GFR calc non Af Amer: >60 mL/min (ref >60)
Potassium: 3.3 mEq/L — ABNORMAL LOW (ref 3.5–5.1)
Total Bilirubin: 0.7 mg/dL (ref 0.3–1.2)

## 2010-11-09 LAB — MAGNESIUM: Magnesium: 1.9 mg/dL (ref 1.5–2.5)

## 2010-11-09 LAB — URINALYSIS, ROUTINE W REFLEX MICROSCOPIC
Glucose, UA: NEGATIVE mg/dL
Protein, ur: NEGATIVE mg/dL

## 2010-11-09 LAB — POCT CARDIAC MARKERS
CKMB, poc: <1 ng/mL — ABNORMAL LOW (ref 1.0–8.0)
Myoglobin, poc: 33.9 ng/mL (ref 12–200)
Troponin i, poc: <0.05 ng/mL (ref 0.00–0.09)

## 2010-11-09 LAB — PROTIME-INR: INR: 1 (ref 0.00–1.49)

## 2010-11-09 LAB — URINE MICROSCOPIC-ADD ON

## 2010-11-18 LAB — POCT URINALYSIS DIP (DEVICE)
Bilirubin Urine: NEGATIVE
Glucose, UA: NEGATIVE mg/dL
Ketones, ur: NEGATIVE mg/dL
Nitrite: NEGATIVE
Specific Gravity, Urine: 1.015 (ref 1.005–1.030)

## 2010-11-18 LAB — BASIC METABOLIC PANEL
BUN: 3 mg/dL — ABNORMAL LOW (ref 6–23)
Creatinine, Ser: 0.58 mg/dL (ref 0.4–1.2)
GFR calc non Af Amer: >60 mL/min (ref >60)
GFR calc non Af Amer: >60 mL/min (ref >60)
Glucose, Bld: 88 mg/dL (ref 70–99)
Potassium: 3.8 mEq/L (ref 3.5–5.1)
Sodium: 138 mEq/L (ref 135–145)

## 2010-11-18 LAB — HEMOGLOBIN A1C: Mean Plasma Glucose: 91 mg/dL

## 2010-11-18 LAB — DIFFERENTIAL
Basophils Absolute: 0.2 10*3/uL — ABNORMAL HIGH (ref 0.0–0.1)
Eosinophils Relative: 2 % (ref 0–5)
Lymphs Abs: 4.2 10*3/uL — ABNORMAL HIGH (ref 0.7–4.0)
Monocytes Relative: 5 % (ref 3–12)
Neutro Abs: 10.1 10*3/uL — ABNORMAL HIGH (ref 1.7–7.7)

## 2010-11-18 LAB — CK TOTAL AND CKMB (NOT AT ARMC)
CK, MB: 1 ng/mL (ref 0.3–4.0)
Total CK: 102 U/L (ref 7–177)
Total CK: 106 U/L (ref 7–177)

## 2010-11-18 LAB — CBC
HCT: 36.4 % (ref 36.0–46.0)
Hemoglobin: 11.2 g/dL — ABNORMAL LOW (ref 12.0–15.0)
Hemoglobin: 12.3 g/dL (ref 12.0–15.0)
RBC: 3.44 MIL/uL — ABNORMAL LOW (ref 3.87–5.11)
RDW: 13.3 % (ref 11.5–15.5)
WBC: 9.5 10*3/uL (ref 4.0–10.5)

## 2010-11-18 LAB — TSH: TSH: 1.536 u[IU]/mL (ref 0.350–4.500)

## 2010-11-18 LAB — POCT PREGNANCY, URINE: Preg Test, Ur: NEGATIVE

## 2010-11-18 LAB — POCT CARDIAC MARKERS
CKMB, poc: 1.4 ng/mL (ref 1.0–8.0)
Troponin i, poc: <0.05 ng/mL (ref 0.00–0.09)

## 2010-11-18 LAB — LIPID PANEL
HDL: 32 mg/dL — ABNORMAL LOW (ref >39)
LDL Cholesterol: 62 mg/dL (ref 0–99)
Total CHOL/HDL Ratio: 3.6 RATIO
VLDL: 20 mg/dL (ref 0–40)

## 2010-11-18 LAB — APTT: aPTT: 74 seconds — ABNORMAL HIGH (ref 24–37)

## 2010-11-18 LAB — PROTIME-INR
INR: 1.1 (ref 0.00–1.49)
Prothrombin Time: 14.3 seconds (ref 11.6–15.2)

## 2010-11-22 ENCOUNTER — Emergency Department (HOSPITAL_COMMUNITY)
Admission: EM | Admit: 2010-11-22 | Discharge: 2010-11-22 | Disposition: A | Discharge status: Home / Self Care | Payer: No Typology Code available for payment source | Attending: Emergency Medicine | Admitting: Emergency Medicine

## 2010-11-22 ENCOUNTER — Emergency Department (HOSPITAL_COMMUNITY): Payer: No Typology Code available for payment source

## 2010-11-22 DIAGNOSIS — Z79899 Other long term (current) drug therapy: Secondary | ICD-10-CM | POA: Insufficient documentation

## 2010-11-22 DIAGNOSIS — R079 Chest pain, unspecified: Secondary | ICD-10-CM | POA: Insufficient documentation

## 2010-11-22 DIAGNOSIS — M79609 Pain in unspecified limb: Secondary | ICD-10-CM | POA: Insufficient documentation

## 2010-11-22 DIAGNOSIS — I10 Essential (primary) hypertension: Secondary | ICD-10-CM | POA: Insufficient documentation

## 2010-11-22 DIAGNOSIS — Y929 Unspecified place or not applicable: Secondary | ICD-10-CM | POA: Insufficient documentation

## 2010-11-22 DIAGNOSIS — T07XXXA Unspecified multiple injuries, initial encounter: Secondary | ICD-10-CM | POA: Insufficient documentation

## 2010-11-22 DIAGNOSIS — I252 Old myocardial infarction: Secondary | ICD-10-CM | POA: Insufficient documentation

## 2010-11-25 ENCOUNTER — Telehealth: Payer: Self-pay | Admitting: Cardiovascular Disease
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
## 2010-12-17 NOTE — H&P (Signed)
[name], Decker Decker          ACCOUNT NO.:  [id number]   MEDICAL RECORD NO.:  [id number]          PATIENT TYPE:  INP   LOCATION:  1826                         FACILITY:  [facility]   PHYSICIAN:  Karma Lew, MD          DATE OF BIRTH:  08/24/1975   DATE OF ADMISSION:  08/09/2008  DATE OF DISCHARGE:                              HISTORY & PHYSICAL   PRIVATE CARDIOLOGIST:  Vanna Scotland. Olevia Perches, MD, [facility]   CHIEF COMPLAINT:  Chest pain.   HISTORY OF PRESENTING ILLNESS:  The patient is a 35 year old African  American female with early history of coronary artery disease.  She is  status post recent PCI to her OM and RCA in November 2009 as well as  uncontrolled hypertension, GERD, and obesity, who comes in with  complaints of chest discomfort over the past 48 hours.  She reports that  the chest pain has been on and off, but in a crescendo-type pattern such  that today it was approximately 5/10.  She reports compliance with her  medications and does not check her blood pressure at home, but found to  have systolic blood pressure of 190s in the ED.  After treatment, she  reports her chest pain almost resolved completely.  She reports the  chest pain was not provoked by anything, but it is more of a constant  pain.  There is no nausea or diaphoresis with this episode.  There has  been no recent PND, orthopnea, or lower extremity edema.  The patient  reports this is a milder form of what she experienced back in November  of this past year.   PAST MEDICAL HISTORY:  1. Coronary artery disease (status post PCI with overlapping 2.5 x 18      mm and 2.75 x 12 mm Promus drug-eluting stent to the OM 1; and a      3.5 x 28 mm Promus to the mid RCA).  2. Hypertension.  3. GERD.  4. Obesity.   MEDICATIONS:  1. Plavix 75 mg a day.  2. Aspirin 325 mg a day.  3. Toprol 50 mg b.i.d.  4. Lisinopril 10 mg a day.  5. Nexium 40 mg a day.   SOCIAL HISTORY:  She lives in [place] and works as a Recruitment consultant.   A  10-pack-year history of smoking, still smoking.  Occasional alcohol use.  No drug use.   FAMILY HISTORY:  Reviewed and noncontributory to the patient's current  medical condition.   ALLERGIES:  No known drug allergies.   REVIEW OF SYSTEMS:  Negative 11-point review of systems except for those  dictated in the above HPI.   PHYSICAL EXAMINATION:  VITAL SIGNS:  Blood pressure is 161/105 as high  as 190/110 in the ED.  Her heart rate is varied between 65 and 85.  She  is afebrile and her respirations are 12.  GENERAL:  Well-developed, well-nourished African American female in no  acute distress.  HEENT:  Moist mucous membranes.  No scleral icterus.  No conjunctival  pallor.  NECK:  Supple.  Full range of motion.  No jugular  venous distention.  CARDIOVASCULAR:  Regular rate and rhythm.  No rubs, murmurs with a  positive S4 gallop.  CHEST:  Clear to auscultation bilaterally.  No wheezes, rales, or  rhonchi.  ABDOMEN:  Soft, nontender, nondistended.  Normoactive bowel sounds.  EXTREMITIES:  No peripheral edema.  Pulses are 2+ bilaterally.  NEUROLOGIC:  Nonfocal.   Chest x-ray demonstrates no acute infiltrative process.  Her EKG, normal  sinus rhythm with a nonspecific inferior T-wave abnormality.   LABORATORY DATA:  Significant for white count of 15.6 and hemoglobin  12.3.  BUN and creatinine of 6 and 0.67.  Her troponin is negative x1  set.   IMPRESSION:  1. Acute coronary syndrome, unstable angina.  2. History of coronary artery disease, detailed above.  3. Hypertensive urgency.  4. Gastroesophageal reflux disease.  5. Obesity.   PLAN:  Admit the patient to telemetry for rule out for myocardial  infarction, although I think it is more likely related to her  hypertensive process.  We will cycle biomarkers in the morning.  I have  aggressively treated her blood pressure to 30% reduction.  She is now  chest pain free.  After labetalol in the ED, we will continue by  doubling  her lisinopril adding hydrochlorothiazide to her regimen as  well.  We will check fasting lipid panel in the morning.  We will also  start statin tonight given her possible acute coronary syndrome.  Also  initiate heparin drip given her recent history of coronary disease.  However, we doubt plaque rupture is playing a role in her current  symptoms.  We will follow the a.m. EKG and should she rule out, I think  it is likely she can go home with that her blood pressure controlled.      Karma Lew, MD  Electronically Signed     JT/MEDQ  D:  08/10/2008  T:  08/10/2008  Job:  [id number]

## 2010-12-17 NOTE — H&P (Signed)
NAMEATHALIAH, [name] NO.:  [id number]   MEDICAL RECORD NO.:  [id number]          PATIENT TYPE:  INP   LOCATION:  8648                         FACILITY:  [facility]   PHYSICIAN:  Karlyn Agee, M.D. DATE OF BIRTH:  May 15, 1976   DATE OF ADMISSION:  06/20/2008  DATE OF DISCHARGE:  06/21/2008                              HISTORY & PHYSICAL      Karlyn Agee, M.D.  Electronically Signed     LC/MEDQ  D:  06/21/2008  T:  06/21/2008  Job:  [id number]

## 2010-12-17 NOTE — Discharge Summary (Signed)
[name], [place]          ACCOUNT NO.:  [id number]   MEDICAL RECORD NO.:  [id number]          PATIENT TYPE:  EMS   LOCATION:  MAJO                         FACILITY:  [facility]   PHYSICIAN:  Vanna Scotland. Olevia Perches, MD, FACCDATE OF BIRTH:  05-May-1976   DATE OF ADMISSION:  08/09/2008  DATE OF DISCHARGE:  08/10/2008                               DISCHARGE SUMMARY   PRIMARY CARDIOLOGIST:  Vanna Scotland. Olevia Perches, MD, [facility]   PRIMARY CARE PHYSICIAN:  Not listed.   PROCEDURES PERFORMED DURING HOSPITALIZATION:  None.   FINAL DISCHARGE DIAGNOSES:  1. Chest pain.  2. Known history of coronary artery disease.      a.     Status post acute coronary syndrome, non-ST elevated       myocardial infarction in November 2009.      b.     Severe 2-vessel coronary artery disease involving the left       circumflex and right coronary artery status post successful 2-       vessel percutaneous coronary intervention by Dr. Burt Knack on       June 21, 2008.  3. Malignant hypertension.  4. Tobacco abuse.  5. Asthma.  6. History of mild leukocytosis without evidence of infection.  7. History of hypokalemia during hospitalization in November 2009.   HOSPITAL COURSE:  A 35 year old [place] American female with known  history of CAD with intervention completed in 2009 with complaints of  chest pain x2 days.  She states she has been compliant with her meds and  she has had crescendo-decrescendo chest discomfort during the day  without exertion.  Secondary to this, the patient was seen in the  emergency room where she was found to be hypertensive with blood  pressure in the 621 systolic.  The patient was given nitroglycerin and  had improvement in symptoms.  The patient's lisinopril was increased  from 10 mg a day to 20 mg daily, dose was given during hospitalization.  The patient's blood pressure began to normalize with a repeat blood  pressure of 165/97, heart rate was 63, and respirations 17.  The patient  had  cardiac enzymes cycled which were found to be negative x3.  Troponin  of 0.01 and 0.01 respectively.  The patient was seen and examined by Dr.  Eustace Quail on the morning of August 10, 2008.  The pain was diagnosed  as atypical.  The patient was increased on her lisinopril to 20 mg daily  and she will follow up with Dr. Olevia Perches in 1 week.  The patient will  return to work on Monday and continue medications as prescribed.  Chest  x-ray on discharge, no acute thoracic findings.   LABORATORY DATA:  Urine pregnancy negative.  Sodium 138, potassium 3.8,  chloride 105, CO2 of 24, glucose 88, BUN 6, and creatinine 0.67.  Hemoglobin 12.3, hematocrit 36.4, white blood cells 15.6, and platelets  375.  Troponin less than 0.05, 0.01, and less than 0.01 respectively.  Cholesterol 114, triglycerides 99, HDL 32, and LDL 62.   DISCHARGE MEDICATIONS:  1. Lisinopril 20 mg daily (increased from 10 mg daily).  2. Aspirin  325 daily.  3. Plavix 75 mg daily.  4. Lipitor 20 mg at bedtime.  5. Metoprolol 50 mg twice a day.   ALLERGIES:  No known drug allergies.   FOLLOWUP PLANS AND APPOINTMENT:  1. The patient will follow up with Dr. Eustace Quail on a prior      scheduled appointment, January 13, at 1:45 p.m.  2. The patient has been given a prescription for lisinopril 20 mg      daily and notify that she has an increase in her dose from 10 mg      daily.   Time spent with the patient to include physician time 35 minutes.      Phill Myron. Purcell Nails, NP      [place] Olevia Perches, MD, [facility]  Electronically Signed    KML/MEDQ  D:  08/10/2008  T:  08/11/2008  Job:  [id number]

## 2010-12-17 NOTE — H&P (Signed)
NAMEDEA, BITTING          ACCOUNT NO.:  [id number]   MEDICAL RECORD NO.:  [id number]          PATIENT TYPE:  EMS   LOCATION:  MAJO                         FACILITY:  [place]   PHYSICIAN:  Audrea Muscat, MD     DATE OF BIRTH:  [birth year]   DATE OF ADMISSION:  03/18/2009  DATE OF DISCHARGE:                              HISTORY & PHYSICAL   CHIEF COMPLAINT:  Chest pain.   HISTORY OF PRESENT ILLNESS:  This is a 35 year old African American  female with a past medical history significant for coronary artery  disease status, post stent placement to the left circumflex and the RCA  with Promo stents, hypertension, hyperlipidemia, ex-tobacco abuse, and  GERD, who presents for further evaluation and treatment of her chest  pain.  Over the past day or so, the patient has had increasing sharp  right-sided chest pain.  It tends to radiate around to her back.  She  denies any recent traumas or injuries.  The pain is worse with movement  but is nonexertional.  The patient denies any orthopnea, PND, or lower  extremity edema.  She denies any lightheadedness, dizziness, presyncope,  or any episodes of syncope.  The patient has had multiple presentations  with musculoskeletal pain.  The patient is being seen on request of the  ED to rule out the patient.   PAST MEDICAL HISTORY:  1. Coronary artery disease with stents to the left circumflex RCA      performed in November 2009.  2. Hypertension.  3. Hyperlipidemia.  4. Ex-tobacco use.  5. Gastroesophageal reflux disease.   SOCIAL HISTORY:  She quit smoking less than a year ago.  She is a social  drinker.  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   FAMILY HISTORY:  Grandfather had s heart attack in his 34s.   ALLERGIES:  NO KNOWN DRUG ALLERGIES.   MEDICATIONS AT HOME:  The patient is on multivitamins, Plavix 75 mg once  daily, Toprol 50 mg twice daily, amlodipine 10 mg once daily, lisinopril  40 mg once daily, Zocor 40 mg once daily,  aspirin 91 mg once daily.   REVIEW OF SYSTEMS:  A 14-point review of systems was performed, and it  is negative except as per HPI.   PHYSICAL EXAMINATION:  VITAL SIGNS:  Blood pressure is 136/87 with a  pulse of 70.  She is satting at 100% on room air.  GENERAL:  No acute distress.  Head normocephalic, atraumatic.  Eyes:  Pupils are equal, round and  reactive to light.  Extraocular muscles are intact.  NECK:  Supple.  No masses, no bruits.  No thyromegaly.  LUNGS:  Clear to auscultation bilaterally.  HEART:  Regular rate and rhythm without any murmurs, rubs or gallops.  ABDOMEN: Positive bowel sounds, soft, nontender, nondistended.  EXTREMITIES:  No clubbing, cyanosis or edema.  NEUROLOGIC:  She is alert and oriented x3.  Moves all extremities x4,  grossly intact.  PSYCHIATRIC:  Appropriate mood, judgment, insight.   LABORATORY EVALUATION:  Initial cardiac enzymes are negative with a CBC  which was unremarkable except for white count of 12.2.  Urinalysis is  negative for UTI, and the patient has some mild potassium deficit of  3.46.   The EKG demonstrates normal sinus rhythm with no acute ST-T changes.  In  comparison with previous EKGs, there is no significant change.   ASSESSMENT:  This is a 35 year old African American female with known  coronary artery disease, status post percutaneous intervention to the  left circumflex and right coronary artery, hypertension, hyperlipidemia,  an ex-tobacco user who presents for concern for chest pain.  1. Chest pain:  Will monitor with telemetry and serial enzymes and      EKG.  Continue the patient on home medications.  If enzymes      elevate, I will increase anticoagulation status.  The patient      believes this is a musculoskeletal pain and given the history and      information so far, that is what is most likely causing her chest      pain.  Will rule out with serial enzymes and once she has ruled      out, will either discharge the  patient or consider some form of      noninvasive assessment.  2. Hypertension:  May need to adjust medications to get blood pressure      less than 130/80 and then continue to monitor closely.  3. Hyperlipidemia.  Continue the patient on Zocor as well as a heart-      healthy diet.   DISPOSITION:  Pending further workup.      Audrea Muscat, MD  Electronically Signed     BS/MEDQ  D:  03/19/2009  T:  03/19/2009  Job:  [id number]

## 2010-12-17 NOTE — H&P (Signed)
[name], [name]          ACCOUNT NO.:  [id number]   MEDICAL RECORD NO.:  [id number]          PATIENT TYPE:  INP   LOCATION:  5504                         FACILITY:  [place]   PHYSICIAN:  Karlyn Agee, M.D. DATE OF BIRTH:  Feb 13, 1976   DATE OF ADMISSION:  06/20/2008  DATE OF DISCHARGE:                              HISTORY & PHYSICAL   PRIMARY CARE PHYSICIAN:  Unassigned.   CHIEF COMPLAINT:  Chest discomfort radiating to the left arm.   HISTORY OF PRESENT ILLNESS:  This is a 35 year old African American lady  with history of hypertension, noncompliance with medication who was in  her baseline state of health until about lunch time.  Yesterday  afternoon, she started having severe chest discomfort, which was  unrelieved by attempts with walking, unrelieved by rest.  Eventually,  the patient went home, took Tylenol Arthritis and fell asleep, but awoke  again with same severe 10/10 chest pain radiating to the left arm.  She  then got off, went to the bathroom and vomited twice, felt sweaty and  diaphoretic and came to the emergency room.  In the emergency room, the  patient's blood pressure was found to markedly elevated and the  Hospitalist Service was called to assist with the patient.   The patient's has been getting no medical followup despite unknown  history of GERD and hypertensive disease.  She did go to the urgent care  about one months ago for an upper respiratory infection and as well as  treating her for upper respiratory infection.  They did give her  Ventolin inhaler for her bronchospasm and hydrochlorothiazide for her  hypertension.  The hydrochlorothiazide since long been completed and the  patient has had no medical followup.   The patient denies orthopnea, PND or lower extremity edema.  She says  recently she has been having episodic palpitations, but she has never  had chest pain like this before.   PAST MEDICAL HISTORY:  GERD, hypertension,  obesity.   MEDICATIONS:  Ventolin inhaler p.r.n., Tylenol Arthritis p.r.n.   ALLERGIES:  No known drug allergies.   SOCIAL HISTORY:  She smokes Black and Mild about 3 per day, drinks about  2 mixed drinks per week and denies any illicit drug use.  Works as a  Teacher, early years/pre.   FAMILY HISTORY:  Significant only for hypertension in her mother.  All  other medical problems she is unaware.   REVIEW OF SYSTEMS:  Significant only for episodic flank pain especially  in her left hip.  Last menstruation is regular, last menstrual period  about 10 days ago it was normal.  She has chronic cough , which she  associates with her smoking, this is not getting worse recently.   PHYSICAL EXAMINATION:  GENERAL:  A pleasant mildly obese African  American lady was lying flattened in stretch, in no acute distress.  VITAL SIGNS:  Her temperature was 98.5, pulse 99, respiration 18, blood  pressure initially 176/117 with sublingual nitroglycerin and nitro  paste.  Her blood pressures come down to 131/84 and her pain has gone  from 10/10 to 0/10.  She is saturating  100% on room air.  HEENT:  Her pupils are round and equal.  Mucous membranes pink and  anicteric.  NECK:  She has no cervical lymphadenopathy or thyromegaly, no jugular  venous distention.  CHEST:  She has diffused mild rhonchi.  CARDIOVASCULAR:  Regular rhythm without murmur.  ABDOMEN:  Obese, soft and nontender. There were no masses.  She has  normal abdominal bowel sounds.  EXTREMITIES:  Without edema.  She has 2+ pulses bilaterally.  CENTRAL NERVES SYSTEM:  Cranial nerves II through XII grossly intact and  she has no focal or neurological deficits.    LABORATORY RESULTS:  Her white count is 14.3, hemoglobin 12.4, platelets 387 with 67%  neutrophils and an absolute granulocyte count of 9.6.  Her sodium is  141.  Her potassium is low at 3.  Cardiac enzymes:  Her myoglobin is  elevated at 293.  CK-MB is normal.  Troponin is  undetectable.   Chest x-ray shows no acute abnormality.   EKG shows normal sinus rhythm with ST depression in the inferolateral  leads.   ASSESSMENT:  1. Unstable angina, as evidenced by chest pain, relieved by      nitroglycerin.  2. Hypertensive urgency.  3. Chronic bronchitis.   PLAN:  Will bring this lady on a chest pain rule-out.  Will assess her  cardiac risk factors and will consult cardiology for assistance with  management.  Other plans as per orders.      Karlyn Agee, M.D.  Electronically Signed     LC/MEDQ  D:  06/21/2008  T:  06/21/2008  Job:  [id number]

## 2010-12-17 NOTE — Discharge Summary (Signed)
[name], [name]          ACCOUNT NO.:  [id number]   MEDICAL RECORD NO.:  [id number]          PATIENT TYPE:  OBV   LOCATION:  4729                         FACILITY:  [place]   PHYSICIAN:  Vanna Scotland. Olevia Perches, MD, FACCDATE OF BIRTH:  Jun 03, 1976   DATE OF ADMISSION:  03/18/2009  DATE OF DISCHARGE:  03/19/2009                               DISCHARGE SUMMARY   PROCEDURES:  None.   PRIMARY FINAL DISCHARGE DIAGNOSIS:  Chest pain, cardiac enzymes negative  for myocardial infarction and outpatient Myoview planned.   SECONDARY DIAGNOSES:  1. Non-ST segment elevation myocardial infarction with drug-eluting      stents to the circumflex and right coronary artery in December      2009.  2. Malignant hypertension.  3. Tobacco use, quit in November.  4. History of asthma.  5. Hypokalemia.  6. Gastroesophageal reflux disease.  7. Obesity.  8. Dyslipidemia with an HDL of 27.7 in December 2009, recheck pending.   TIME OF DISCHARGE:  Thirty nine minutes.   HOSPITAL COURSE:  [name] is a 35 year old female with known  coronary artery disease.  She had chest pain on the day of admission  that was right sided and radiated to her back.  It was worse with  movement.  She was admitted for further evaluation.   Her cardiac enzymes were negative for MI.  Her EKG did not have acute  changes.  Her symptoms resolved.  Her potassium was supplemented as it  was 3.3 on admission.  Her nonfasting glucose was 134 on admission and 5  hours later, her blood sugar was 161, but is not clear if this was  fasting or not.   On March 19, 2009, Dr. Olevia Perches evaluated [name] and felt that  since her symptoms had resolved and her enzymes were negative, she could  be safely discharged home with close outpatient followup.  He  recommended adding Voltaren to her medication regimen short-term for  possible musculoskeletal pain.   DISCHARGE INSTRUCTIONS:  1. Her activity level is to be increased  gradually.  2. She is to stick to a low-sodium heart-healthy diet and she is      recommended to decrease her carbohydrates.  3. She is to follow up with Dr. Olevia Perches as scheduled on April 06, 2009 at 2:45.  She is to get a stress test this Thursday, March 22, 2009 at 9:15 a.m.   DISCHARGE MEDICATIONS:  Per the computerized list.      Rosaria Ferries, PA-C      Bruce R. Olevia Perches, MD, [facility]  Electronically Signed    RB/MEDQ  D:  03/19/2009  T:  03/19/2009  Job:  [id number]

## 2010-12-17 NOTE — Assessment & Plan Note (Signed)
[place] OFFICE NOTE   [name], [name]                 MRN:          [id number]  DATE:07/06/2008                            DOB:          Apr 04, 1976    PRIMARY CARE PHYSICIAN:  Unknown.   CLINICAL HISTORY:  [name] is 35 years old and returned for  followup after a recent non-ST-elevation myocardial infarction.  She was  admitted to the [facility] with chest pain and had positive  troponins.  I saw in consult and she underwent catheterization by Dr.  Burt Knack and had 2-vessel PCI of the right coronary artery and circumflex  artery.  There was residual disease in the distal circumflex artery.  She has done well since discharge and had no recurrent chest pain,  shortness of breath, or palpitations.   PAST HISTORY SIGNIFICANT:  Significant for hypertension, hyperlipidemia,  asthma, and tobacco use.   CURRENT MEDICATIONS:  1. Plavix.  2. Aspirin 325 mg daily.  3. Simvastatin 40 mg daily.  4. Metoprolol 50 mg b.i.d.  5. Lisinopril 2.5 mg daily.  6. Ventolin 2 puffs every 4 hours p.r.n.  7. Nicotine patch.  8. Multivitamins.   REVIEW OF SYSTEMS:  Indicates that she has had some indigestion and she  did not take her aspirin 1 day.  She has smoked about 3 cigarettes since  discharge.   PHYSICAL EXAMINATION:  VITAL SIGNS:  Today, her blood pressure is 161/92  and pulse 70 and regular.  NECK:  There is no venous distention.  The carotid pulses were full  without bruits.  CHEST:  Clear.  CARDIAC:  Rhythm was regular .  I could hear no murmurs or gallops.  ABDOMEN:  Soft without organomegaly.  EXTREMITIES:  Peripheral pulses were full with no peripheral edema.  The  right femoral artery site was well healed.   Her electrocardiogram showed slight inferolateral ST-T changes.   IMPRESSION:  1. Recent non-ST-elevation myocardial infarction.  2. Status post percutaneous coronary  intervention of the right      coronary and circumflex artery using drug-eluting stents November      2009.  3. Hypertension.  4. Hyperlipidemia.  5. Tobacco use.  6. Asthma   RECOMMENDATIONS:  I think, [name] was doing well.  Her blood  pressure is not well controlled.  I will increase her Lasix from 2.5-10  mg daily.  We will get a fasting lipids, livers, and BNP today.  Unfortunately, she cannot get into the rehab program right now because  of detectable problem, but hopefully she will be able to get into this  in the future.  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  I will plan to see her back in followup in 6 weeks.     Bruce Alfonso Patten Olevia Perches, MD, [facility]  Electronically Signed    BRB/MedQ  DD: 07/06/2008  DT: 07/07/2008  Job #: [id number]

## 2010-12-17 NOTE — Cardiovascular Report (Signed)
[name], [name]          ACCOUNT NO.:  [id number]   MEDICAL RECORD NO.:  [id number]           PATIENT TYPE:   LOCATION:                                 FACILITY:   PHYSICIAN:  Juanda Bond. Burt Knack, MD  DATE OF BIRTH:  [birth year]   DATE OF PROCEDURE:  06/21/2008  DATE OF DISCHARGE:                            CARDIAC CATHETERIZATION   PROCEDURES:  Left heart catheterization, selective coronary angiography,  left ventricular angiography, percutaneous transluminal coronary  angioplasty and stenting of the obtuse marginal artery 1 branch of the  left circumflex, percutaneous transluminal coronary angioplasty and  stenting of the right coronary artery, Angio-Seal of the right femoral  artery.   INDICATIONS:  Ms. Ly is a 35 year old woman who presented with  typical symptoms of an acute coronary syndrome.  She ruled in for  myocardial infarction with elevated CK-MBs and troponins.  She has risk  factors of hypertension and tobacco abuse.  She was referred for cardiac  catheterization.   Risks and indications of the procedure were reviewed with the patient,  informed consent was obtained.  The right groin was prepped and draped  and anesthetized with 1% lidocaine using modified Seldinger technique.  A 5-French sheath was placed in the right femoral artery.  Standard 5-  French Judkins catheters were used for coronary angiography and left  ventriculography.  All catheter exchanges were performed over guidewire.  The patient tolerated the procedure well.  There were no immediate  complications.   Following diagnostic angiography, I elected to proceed with PCI.  The  patient has severe 90% stenosis of the OM-1 branch of the left  circumflex.  She had total occlusion of the left circumflex, just after  this branch that was filled from a well-formed collateral off the right  coronary artery.  The occlusion did not appear acute.  She also had  severe long segment stenosis of the  mid right coronary artery with a 90%  lesion in that vessel.  The LAD had diffuse nonobstructive plaque.  I  elected to intervene on the left OM-1 branch in the mid right coronary  artery.  Angiomax was used for anticoagulation.  The patient has been  preloaded with clopidogrel 600 mg in the emergency room.  An XB 3.5-cm 6-  [place] guide catheter was inserted.  Once the therapeutic ACT was  achieved, a Cougar guidewire was advanced into the OM-1 branch beyond  the area of severe stenosis.  The area was predilated with a 2.0 x 12 mm  Voyager balloon.  Following predilatation, I elected to treat the lesion  with a 2.5 x 18 mm Promus stent which was deployed at 12 atmospheres.  The stent appeared under expanded at the area of lesion.  A 2.5 x 15 mm  Voyager Lamboglia balloon was inserted and used to post dilate the stent up to  16 atmospheres.  This improved stent expansion.  There was TIMI 3 flow  following stenting.  However, at the proximal edge of the stent, there  was significant residual stenosis that I elected to cover with a 2.75 x  12 mm Promus stent which  was carefully positioned to overlap the initial  stent and cover the area of disease.  The stent was deployed at 14  atmospheres and was well expanded.  I then advanced the stent balloon to  cover the overlapped area, and it was inflated again to 16 atmospheres  for postdilatation.  At the completion of the procedure, there was an  excellent angiographic result with well expanded stents and TIMI 3 flow.  At that point, I elected to move onto the right coronary artery.  Right  coronary artery was engaged with a 6-French JR4 guide catheter.  The  same Cougar guidewire was advanced into an acute marginal branch which  arose from the severe area of stenosis.  The acute marginal branch had a  90% ostial stenosis and was a large vessel.  The wire was left in that  branch and a second Cougar guidewire was advanced beyond the area of  stenosis  into the distal right coronary artery.  I elected to predilate  the acute marginal branch with the same 2.0 x 12 mm Voyager balloon  which was taken to 6 atmospheres for a single inflation.  I then stented  the mid right coronary artery with a 3.5 x 28 mm Promus stent which was  carefully positioned and deployed at 14 atmospheres.  The stent had  residual stenosis after it was placed as the lesion was somewhat  resistant.  Therefore, I postdilated the entire stented segment with a  3.75 x 20 mm Voyager Loris balloon up to 16 and then 18 atmospheres to  cover the stented segment.  There was improvement in stent expansion and  an excellent angiographic result with TIMI 3 flow in the vessel to  completion of the procedure.  The right femoral arteriotomy was closed  with an Angio-Seal device.  The patient tolerated the procedure well.  There were no immediate complications.   FINDINGS:  Aortic pressure 166/104 with a mean of 131, left ventricular  pressure 166/18.   Left mainstem is widely patent.  The left main has no significant  angiographic stenosis.  It bifurcates into the LAD and left circumflex.   LAD:  The LAD is a large-caliber vessel that courses down and wraps  around the LV apex.  There is diffuse plaque throughout the LAD with 30%  long segment stenosis proximally and a 40-50% stenosis in the mid to  distal segment.  There are no hemodynamically significant stenoses in  the LAD.  The proximal LAD supplies a large first diagonal branch, and  there are smaller second and third diagonals.  There are no significant  stenoses in the diagonal branches, but the first diagonal has  nonobstructive 30-40% stenosis in the proximal portion.   Left circumflex:  The left circumflex is patent in the proximal portion.  It supplies the first OM which has severe 90% eccentric stenosis in its  midportion.  There is diffuse plaque proximal to the stenosis producing  a 50% stenosis.  The remaining  portions of the OM are patent.  Just  beyond the OM branch, the left circumflex is occluded in the AV groove.  The mid and distal left circumflex fill from a well-formed collateral  off the distal right coronary artery and also the distal AV groove  circumflex fills from the LAD collaterals coming off septal perforating  vessels.   Right coronary artery:  The right coronary artery is also severely  diseased.  The vessel courses down and has a long  segment of severe 80-  90% stenosis in the midportion.  There is an acute marginal branch that  arises from that segment of disease and it has a severe 90% ostial  stenosis.  The right coronary artery courses down and supplies a PDA  branch, as well as 2 posterolateral branches, and again the second  posterolateral branch supplies a well-formed collateral to the AV groove  circumflex.   Left ventriculography shows mild inferolateral hypokinesis with overall  preserved LVEF of 60%.   ASSESSMENT:  1. Severe 2-vessel coronary artery disease involving the left      circumflex and right coronary artery.  2. Diffuse nonobstructive left anterior descending stenosis.  3. Widely patent left mainstem.  4. Preserved left ventricular ejection fraction.  5. Successful 2-vessel percutaneous coronary intervention as outlined      above.   PLAN:  Recommend a minimum of 12 months of aspirin and Plavix.  The  patient has advanced CAD for her young age.  She needs very aggressive  risk factor modification.      Juanda Bond. Burt Knack, MD  Electronically Signed     MDC/MEDQ  D:  06/21/2008  T:  06/22/2008  Job:  [id number]

## 2010-12-17 NOTE — Assessment & Plan Note (Signed)
[facility] OFFICE NOTE   JENNIFE, ZAUCHA                 MRN:          [id number]  DATE:08/16/2008                            DOB:          October 25, 1975    PRIMARY CARE PHYSICIAN:  Undecided.   CLINICAL HISTORY:  Ms. Malecki is 35 years old and was recently  hospitalized with a non-ST-elevation myocardial infarction.  She  underwent a two-vessel PCI by Dr. Burt Knack with a good result.  She was  left with some residual disease in the distal circumflex artery.  She  has good LV function.  She was seen admitted briefly about a week and a  half ago with chest pain and hypertension.  We felt the chest pain was  quite atypical for ischemia and we released her after her enzymes were  negative.   She has been doing fairly well since that time.  She has had some chest  pain up in her chest into her back that seems to be worse, seems to be  related to position.  She has had no exertionally-related chest pain.   Her past medical history is significant for hypertension,  hyperlipidemia, and previous tobacco use, although she has now quit.  She also has a history of asthma.   Her current medication include:  1. Aspirin.  2. Ventolin.  3. Simvastatin 40 mg daily.  4. Plavix.  5. Multivitamins.  6. Metoprolol 50 mg b.i.d.  7. Lisinopril 20 mg daily.  This was increased after her ER visit from      10 mg.   On examination, blood pressure 160/100, the pulse 71 and regular.  There  was no venous distension.  The carotid pulses were full without bruits.  Chest was clear.  Cardiac rhythm was regular.  I could hear no murmurs  or gallops.  The abdomen was soft without organomegaly.  Peripheral  pulses were full with no peripheral edema.   IMPRESSION:  1. Chest pain atypical for ischemia and felt to be musculoskeletal.  2. Coronary artery disease status post non-ST-elevation myocardial      infarction in November  with subsequent three-vessel percutaneous      coronary intervention with drug-eluting stents.  3. Good left ventricular function.  4. Hypertension.  5. Hyperlipidemia.  6. Tobacco use.   RECOMMENDATIONS:  I think Ms. Palmisano is doing fairly well from her  heart.  Her blood pressure is not under good control.  I think she will  need a calcium channel blocker to get this under control, and we started  her on Norvasc 5 mg a day.  We will have her come back for blood  pressure check in 1 week.  She thinks she may get established with  primary care Dr. Milinda Cave office on [facility].  We will give her  records today to carry there.  She has a recent cold and I told her she  could take Claritin and Robitussin DM.  I plan to see her back in  followup in 3 months.  She could not do the rehab program because of the  copay from her insurance was too high.     Bruce Alfonso Patten Olevia Perches, MD, [facility]  Electronically Signed    BRB/MedQ  DD: 08/16/2008  DT: 08/17/2008  Job #: [id number]

## 2010-12-17 NOTE — Discharge Summary (Signed)
[name], [name]          ACCOUNT NO.:  [id number]   MEDICAL RECORD NO.:  [id number]          PATIENT TYPE:  INP   LOCATION:  6526                         FACILITY:  [facility]   PHYSICIAN:  Rexene Alberts, M.D.    DATE OF BIRTH:  [birth year]   DATE OF ADMISSION:  06/20/2008  DATE OF DISCHARGE:  06/23/2008                               DISCHARGE SUMMARY   DISCHARGE DIAGNOSES:  1. Acute coronary syndrome/non-ST-elevation myocardial infarction.  2. Severe 2-vessel coronary artery disease involving the left      circumflex and right coronary artery, status post successful 2-      vessel percutaneous coronary intervention by Dr. Sherren Mocha on      June 21, 2008.  3. Malignant hypertension.  4. Tobacco abuse.  5. Asthma.  6. Mild leukocytosis with no evidence of infection;  afebrile during      the hospitalization.  7. Hypokalemia, repleted during the hospitalization.  Magnesium level      was within normal limits at 2.2.   DISCHARGE MEDICATIONS:  1. Plavix 75 mg daily.  2. Aspirin 325 mg daily.  3. Simvastatin 40 mg at bedtime.  4. Metoprolol 50 mg b.i.d.  5. Lisinopril 2.5 mg daily.  6. Ventolin MDI 2 puffs every 4 hours as needed.  7. Nicotine patch, use as directed on the label.  8. Multivitamin once daily.   DISCHARGE DISPOSITION:  The patient is being discharged to home in  improved and stable condition today.  She was advised to follow up with  cardiologist, Dr. Olevia Perches on July 05, 2008 at 1:15 p.m.   CONSULTATIONS:  1. Bruce R. Olevia Perches, MD, [facility]  2. Juanda Bond. Burt Knack, MD   PROCEDURES PERFORMED:  1. Cardiac catheterization on June 21, 2008.  The results revealed      severe 2-vessel coronary artery disease involving the left      circumflex and right coronary artery.  Diffuse nonobstructive left      anterior descending stenosis.  Widely patent left main stem.      Preserved left ventricular ejection fraction (60%).  Successful 2-      vessel PCI as  outlined above.  2. Chest x-ray on June 21, 2008.  The results revealed no acute      cardiopulmonary abnormality.   HISTORY OF PRESENT ILLNESS:  The patient is a 35 year old woman with a  past medical history significant for hypertension, noncompliance with  medication therapy, and asthma, who presented to the emergency  department on June 20, 2008 with a chief complaint of severe chest  pain.  The chest pain was associated with shortness of breath,  diaphoresis, and nausea.  When she was evaluated in the emergency  department, she was noted to be hypertensive with a blood pressure of  176/117.  Her EKG revealed normal sinus rhythm with a heart rate of 79  beats per minute, prolonged QT interval, and borderline LVH.  Her  initial cardiac markers in the emergency department revealed a troponin  I of less than 0.05, CK-MB of 2.4, and a myoglobin of 293.  The patient  was admitted for further evaluation  and management.   For additional details, please see the dictated history and physical.   HOSPITAL COURSE:  1. NON-ST ELEVATION MYOCARDIAL INFARCTION, CORONARY ARTERY DISEASE,      HYPERTENSIVE URGENCY, AND TOBACCO ABUSE.  The patient was started      on immediate treatment with oxygen, as-needed nitroglycerin,      aspirin, and Lopressor 25 mg b.i.d.  Subsequently, she was started      on Nitropaste at 1 inch every 6 hours when the sublingual      nitroglycerin actually relieved her chest pain.  For further      evaluation, a urine drug screen, TSH, fasting lipid panel, and      cardiac enzymes were ordered.  The first set of cardiac enzymes was      positive with a CK of 306, CK-MB of 21.1, and a troponin I of 1.85.      Her TSH was within normal limits at 1.470.  Her urine drug screen      was negative.  Her fasting lipid panel revealed a total cholesterol      of 147, triglycerides of 166, HDL cholesterol of 23, and LDL      cholesterol of 91.  Given the findings of the  positive cardiac      enzymes, cardiologist Dr. Olevia Perches was consulted.  He evaluated the      patient and recommended further evaluation with a cardiac      catheterization.  Dr. Sherren Mocha performed the cardiac      catheterization on June 21, 2008.  The results of the cardiac      catheterization are above.  Following the intervention, the patient      was started on Plavix and Crestor.  The metoprolol had to be      titrated upward to 50 mg b.i.d.  For additional blood pressure      management, lisinopril was added at 2.5 mg daily just prior to      hospital discharge.  The patient did admit to smoking on a regular      basis.  Therefore, she was counseled on tobacco cessation.  The      nicotine patch was placed empirically.  Prior to hospital      discharge, the Nitropaste was titrated off.  Her final cardiac      panel revealed a total CK of 194, CK-MB of 6.3, and troponin I of      1.99.  She is currently chest pain-free.  She was advised to follow      up with Dr. Olevia Perches on July 05, 2008.  2. ASTHMA.  The patient was started on treatment with albuterol      nebulizers every 6 hours.  The nebulizers were discontinued and she      was given albuterol MDI instead.  Bronchospasms will need to be      monitored on beta-blockade therapy.      Rexene Alberts, M.D.  Electronically Signed     DF/MEDQ  D:  06/23/2008  T:  06/24/2008  Job:  [id number]   cc:   Vanna Scotland. Olevia Perches, MD, [facility]  Juanda Bond. Burt Knack, MD

## 2010-12-27 ENCOUNTER — Encounter: Payer: Self-pay | Admitting: Cardiovascular Disease

## 2011-01-06 ENCOUNTER — Encounter: Payer: Self-pay | Admitting: Cardiovascular Disease

## 2011-01-06 ENCOUNTER — Ambulatory Visit (INDEPENDENT_AMBULATORY_CARE_PROVIDER_SITE_OTHER): Payer: BC Managed Care – PPO | Admitting: Cardiovascular Disease

## 2011-01-06 VITALS — BP 140/97 | HR 67 | Resp 18 | Ht 62.0 in | Wt 233.4 lb

## 2011-01-06 DIAGNOSIS — E7800 Pure hypercholesterolemia, unspecified: Secondary | ICD-10-CM

## 2011-01-06 DIAGNOSIS — E785 Hyperlipidemia, unspecified: Secondary | ICD-10-CM

## 2011-01-06 DIAGNOSIS — I10 Essential (primary) hypertension: Secondary | ICD-10-CM

## 2011-01-06 DIAGNOSIS — I2510 Atherosclerotic heart disease of native coronary artery without angina pectoris: Secondary | ICD-10-CM

## 2011-01-06 MED ORDER — HYDROCHLOROTHIAZIDE 25 MG PO TABS: 25.0000 mg | ORAL_TABLET | Freq: Every day | ORAL | Status: DC

## 2011-01-06 NOTE — Assessment & Plan Note (Signed)
The patient is stable without angina. I have recommended reducing her aspirin dose to 81 mg daily. She will continue with Plavix and we'll reassess this in about 6 months. At that point she will be 2 years out from her intervention in will consider discontinuation of Plavix. Otherwise recommended refocusing on exercise and weight loss and continued aggressive risk reduction.

## 2011-01-06 NOTE — Patient Instructions (Signed)
Decrease Aspirin to 33m daily. This should be enteric coated.  Start Hydrochlorothiazide (HCTZ)  279mdaily.  Schedule an appointment for fasting lab in 2 weeks--lipid profile/liver profile/BMP 414.01  401.1 272.0  Your physician wants you to follow-up in: 6 months with Dr CoBurt Knack([id number]).You will receive a reminder letter in the mail two months in advance. If you don't receive a letter, please call our office to schedule the follow-up appointment.

## 2011-01-06 NOTE — Assessment & Plan Note (Signed)
Blood pressure above goal on a combination of amlodipine, lisinopril, and metoprolol. Will add hydrochlorothiazide 25 mg daily and check a metabolic panel in about 2 weeks.

## 2011-01-06 NOTE — Assessment & Plan Note (Signed)
The patient's LDL was less than 100. The last check was about one year ago. When she comes in for blood work in 2 weeks will repeat lipids and LFTs. She is on statin therapy with low-dose simvastatin.

## 2011-01-06 NOTE — Progress Notes (Signed)
HPI:  This is a 64 row African American woman presenting for followup evaluation. The patient has coronary artery disease and presented with a non-ST elevation infarction back in 2009. She was found to have critical stenosis of the right coronary artery and left circumflex and was treated with drug-eluting stents in both vessels.  The catheterization report was reviewed and she was treated with overlapping drug-eluting stents in the obtuse marginal branch the left circumflex and with a long drug-eluting stent in the mid right coronary artery. The patient has done well over the past few years without exertional chest tightness or pain. She reports compliance with her medical program. She has quit smoking cigarettes. She does note significant weight gain since tobacco discontinuation. She specifically denies chest pain, chest tightness, dyspnea, edema, orthopnea, or PND. She notes that her blood pressure has been running high.  Outpatient Encounter Prescriptions as of 01/06/2011  Medication Sig Dispense Refill  . albuterol (VENTOLIN HFA) 108 (90 BASE) MCG/ACT inhaler Inhale 2 puffs into the lungs as needed.        Marland Kitchen amLODipine (NORVASC) 10 MG tablet Take 10 mg by mouth daily.        . clopidogrel (PLAVIX) 75 MG tablet Take 75 mg by mouth daily.        Marland Kitchen lisinopril (PRINIVIL,ZESTRIL) 40 MG tablet Take 1 tablet (40 mg total) by mouth daily.  30 tablet  6  . metoprolol (LOPRESSOR) 50 MG tablet Take 50 mg by mouth 2 (two) times daily.        . Multiple Vitamin (MULTIVITAMIN) capsule Take 1 capsule by mouth daily.        . simvastatin (ZOCOR) 20 MG tablet Take 20 mg by mouth at bedtime.        Marland Kitchen DISCONTD: aspirin EC 325 MG tablet Take 325 mg by mouth daily.        Marland Kitchen aspirin EC 81 MG tablet Take 1 tablet (81 mg total) by mouth daily.      . hydrochlorothiazide 25 MG tablet Take 1 tablet (25 mg total) by mouth daily.  30 tablet  6    No Known Allergies  Past Medical History  Diagnosis Date  . Hypertension     . Coronary artery disease   . GERD (gastroesophageal reflux disease)   . Obesity     ROS: Negative except as per HPI  BP 140/97  Pulse 67  Resp 18  Ht 5' 2"  (1.575 m)  Wt 233 lb 6.4 oz (105.87 kg)  BMI 42.69 kg/m2  PHYSICAL EXAM: Pt is alert and oriented, obese very pleasant woman in NAD. HEENT: normal Neck: JVP - normal, carotids 2+= without bruits Lungs: CTA bilaterally CV: RRR without murmur or gallop Abd: soft, NT, Positive BS, no hepatomegaly Ext: no C/C/E, distal pulses intact and equal Skin: warm/dry no rash  EKG:  Reviewed from April 20 EKG demonstrating normal sinus rhythm at 69 beats per minute and essentially within normal limits.  ASSESSMENT AND PLAN:

## 2011-01-20 ENCOUNTER — Other Ambulatory Visit (INDEPENDENT_AMBULATORY_CARE_PROVIDER_SITE_OTHER): Payer: BC Managed Care – PPO | Admitting: *Deleted

## 2011-01-20 ENCOUNTER — Other Ambulatory Visit: Payer: Self-pay | Admitting: *Deleted

## 2011-01-20 DIAGNOSIS — I2510 Atherosclerotic heart disease of native coronary artery without angina pectoris: Secondary | ICD-10-CM

## 2011-01-20 DIAGNOSIS — E7800 Pure hypercholesterolemia, unspecified: Secondary | ICD-10-CM

## 2011-01-20 DIAGNOSIS — I10 Essential (primary) hypertension: Secondary | ICD-10-CM

## 2011-01-20 LAB — LIPID PANEL
Cholesterol: 168 mg/dL (ref 0–200)
HDL: 39.9 mg/dL (ref >39.00)
LDL Cholesterol: 97 mg/dL (ref 0–99)
VLDL: 31.4 mg/dL (ref 0.0–40.0)

## 2011-01-20 LAB — HEPATIC FUNCTION PANEL
Bilirubin, Direct: 0.1 mg/dL (ref 0.0–0.3)
Total Protein: 7.5 g/dL (ref 6.0–8.3)

## 2011-01-20 LAB — BASIC METABOLIC PANEL
BUN: 13 mg/dL (ref 6–23)
CO2: 25 mEq/L (ref 19–32)
Calcium: 9.6 mg/dL (ref 8.4–10.5)
Creatinine, Ser: 0.8 mg/dL (ref 0.4–1.2)
Glucose, Bld: 104 mg/dL — ABNORMAL HIGH (ref 70–99)
Sodium: 136 mEq/L (ref 135–145)

## 2011-01-20 MED ORDER — CLOPIDOGREL BISULFATE 75 MG PO TABS: 75.0000 mg | ORAL_TABLET | Freq: Every day | ORAL | Status: DC

## 2011-01-22 ENCOUNTER — Other Ambulatory Visit: Payer: Self-pay | Admitting: *Deleted

## 2011-01-28 ENCOUNTER — Other Ambulatory Visit: Payer: Self-pay | Admitting: *Deleted

## 2011-01-28 MED ORDER — METOPROLOL TARTRATE 50 MG PO TABS: 50.0000 mg | ORAL_TABLET | Freq: Two times a day (BID) | ORAL | Status: DC

## 2011-02-13 ENCOUNTER — Other Ambulatory Visit: Payer: Self-pay | Admitting: *Deleted

## 2011-02-13 ENCOUNTER — Telehealth: Payer: Self-pay | Admitting: Cardiovascular Disease

## 2011-02-13 DIAGNOSIS — E782 Mixed hyperlipidemia: Secondary | ICD-10-CM

## 2011-02-13 MED ORDER — ATORVASTATIN CALCIUM 20 MG PO TABS: 20.0000 mg | ORAL_TABLET | Freq: Every day | ORAL | Status: DC

## 2011-02-13 NOTE — Telephone Encounter (Signed)
Called patient back. She advised me that she received a letter from her insurance company advising her the she needs to be on a lower dose of Simvastatin because she is also taking Norvasc. She states that she has been having muscle aches for several months but did not report this to Dr.Cooper. Advised I will ask Dr.Wall (DOD) because Dr.Cooper is on vacation and call her back.  Discussed above with Dr.Wall. He ordered to stop Simvastatin and start generic Lipitor 71m and have repeat fasting labs in 6 weeks. Patient aware of above plan and she will return for labs on 8/24.

## 2011-02-13 NOTE — Telephone Encounter (Signed)
Per pt call, pt is concerned about dosage of simvastatin and amlodipine and the side effects associated with those. Simvastatin side effect is an increase risk of muscle pain, pt is experiencing muscle pain. Pt is wondering should the dosage be lowered. Please return pt call to advise.

## 2011-03-28 ENCOUNTER — Other Ambulatory Visit (INDEPENDENT_AMBULATORY_CARE_PROVIDER_SITE_OTHER): Payer: BC Managed Care – PPO | Admitting: *Deleted

## 2011-03-28 DIAGNOSIS — E782 Mixed hyperlipidemia: Secondary | ICD-10-CM

## 2011-03-28 LAB — LIPID PANEL
HDL: 39.6 mg/dL (ref >39.00)
Total CHOL/HDL Ratio: 3

## 2011-03-28 LAB — HEPATIC FUNCTION PANEL
AST: 22 U/L (ref 0–37)
Bilirubin, Direct: 0 mg/dL (ref 0.0–0.3)
Total Bilirubin: 0.4 mg/dL (ref 0.3–1.2)

## 2011-05-06 LAB — BASIC METABOLIC PANEL
BUN: 2 — ABNORMAL LOW
BUN: 3 — ABNORMAL LOW
CO2: 25
Chloride: 105
Chloride: 110
Creatinine, Ser: 0.64
GFR calc non Af Amer: >60
Glucose, Bld: 102 — ABNORMAL HIGH
Glucose, Bld: 98
Potassium: 2.9 — ABNORMAL LOW
Potassium: 3.6
Sodium: 138

## 2011-05-06 LAB — URINE DRUGS OF ABUSE SCREEN W ALC, ROUTINE (REF LAB)
Amphetamine Screen, Ur: NEGATIVE
Barbiturate Quant, Ur: NEGATIVE
Benzodiazepines.: NEGATIVE
Marijuana Metabolite: NEGATIVE
Methadone: NEGATIVE
Propoxyphene: NEGATIVE

## 2011-05-06 LAB — CBC
HCT: 33.5 — ABNORMAL LOW
HCT: 34.3 — ABNORMAL LOW
HCT: 36.6
Hemoglobin: 11.6 — ABNORMAL LOW
Hemoglobin: 11.8 — ABNORMAL LOW
MCHC: 34.6
MCV: 97
MCV: 97.7
Platelets: 361
Platelets: 387
RBC: 3.41 — ABNORMAL LOW
RDW: 14.3
RDW: 14.7
WBC: 14.3 — ABNORMAL HIGH

## 2011-05-06 LAB — TSH: TSH: 1.47

## 2011-05-06 LAB — CARDIAC PANEL(CRET KIN+CKTOT+MB+TROPI)
CK, MB: 21.1 — ABNORMAL HIGH
CK, MB: 6.3 — ABNORMAL HIGH
Relative Index: 3.2 — ABNORMAL HIGH
Total CK: 302 — ABNORMAL HIGH
Troponin I: 1.85

## 2011-05-06 LAB — COMPREHENSIVE METABOLIC PANEL
ALT: 12
Alkaline Phosphatase: 68
BUN: 4 — ABNORMAL LOW
CO2: 21
GFR calc non Af Amer: >60
Glucose, Bld: 87
Potassium: 3.6
Sodium: 137

## 2011-05-06 LAB — DIFFERENTIAL
Eosinophils Absolute: 0.1
Eosinophils Relative: 1
Lymphocytes Relative: 26
Lymphs Abs: 3.7
Monocytes Relative: 6
Neutrophils Relative %: 67

## 2011-05-06 LAB — POCT CARDIAC MARKERS: CKMB, poc: 2.4

## 2011-05-06 LAB — POCT I-STAT, CHEM 8
BUN: <3 — ABNORMAL LOW
Creatinine, Ser: 0.8
Glucose, Bld: 104 — ABNORMAL HIGH
Hemoglobin: 12.6
Potassium: 3 — ABNORMAL LOW

## 2011-05-06 LAB — MAGNESIUM: Magnesium: 2.2

## 2011-05-06 LAB — PROTIME-INR: Prothrombin Time: 13.3

## 2011-05-06 LAB — LIPID PANEL: Cholesterol: 147

## 2011-05-22 ENCOUNTER — Other Ambulatory Visit: Payer: Self-pay | Admitting: Cardiology

## 2011-05-27 ENCOUNTER — Other Ambulatory Visit: Payer: Self-pay | Admitting: Cardiovascular Disease

## 2011-05-27 MED ORDER — AMLODIPINE BESYLATE 10 MG PO TABS: 10.0000 mg | ORAL_TABLET | Freq: Every day | ORAL | Status: DC

## 2011-08-04 ENCOUNTER — Other Ambulatory Visit: Payer: Self-pay | Admitting: Cardiovascular Disease

## 2011-08-29 ENCOUNTER — Ambulatory Visit (INDEPENDENT_AMBULATORY_CARE_PROVIDER_SITE_OTHER): Payer: BC Managed Care – PPO | Admitting: Cardiovascular Disease

## 2011-08-29 ENCOUNTER — Encounter: Payer: Self-pay | Admitting: Cardiovascular Disease

## 2011-08-29 DIAGNOSIS — E785 Hyperlipidemia, unspecified: Secondary | ICD-10-CM

## 2011-08-29 DIAGNOSIS — I10 Essential (primary) hypertension: Secondary | ICD-10-CM

## 2011-08-29 DIAGNOSIS — I2510 Atherosclerotic heart disease of native coronary artery without angina pectoris: Secondary | ICD-10-CM

## 2011-08-29 MED ORDER — ALBUTEROL SULFATE HFA 108 (90 BASE) MCG/ACT IN AERS: 2.0000 | INHALATION_SPRAY | RESPIRATORY_TRACT | Status: DC | PRN

## 2011-08-29 NOTE — Patient Instructions (Signed)
Your physician wants you to follow-up in: 6 MONTHS. You will receive a reminder letter in the mail two months in advance. If you don't receive a letter, please call our office to schedule the follow-up appointment.  Your physician has requested that you have an exercise stress myoview next week. For further information please visit [email or web address]. Please follow instruction sheet, as given.  Your physician recommends that you return for a FASTING LIPID, LIVER and BMP in 6 MONTHS--nothing to eat or drink after midnight, lab opens at 8:30  Your physician recommends that you continue on your current medications as directed. Please refer to the Current Medication list given to you today.

## 2011-08-29 NOTE — Assessment & Plan Note (Signed)
Lipids reviewed and they're at goal. She continues with atorvastatin 20 mg daily.

## 2011-08-29 NOTE — Progress Notes (Signed)
HPI:  This is a 14 your old woman presenting for followup evaluation.  The patient has coronary artery disease and presented with non-ST elevation infarction in 2009. She was treated with stenting of the right coronary artery and left circumflex vessels. She's had preserved LV function.  Her last stress test was in 2010 and this showed findings consistent with breast attenuation and mild ischemia in the inferobasal. Her ejection fraction was 65%. She has done well with medical therapy.  The patient denies chest pain or pressure. She has mild dyspnea with exertion. She has some limitations from the pain. She denies orthopnea, PND, or edema. When I saw her last she was started on hydrochlorothiazide because of elevated blood pressure and she has tolerated this medication well.  Outpatient Encounter Prescriptions as of 08/29/2011  Medication Sig Dispense Refill  . albuterol (VENTOLIN HFA) 108 (90 BASE) MCG/ACT inhaler Inhale 2 puffs into the lungs as needed.        Marland Kitchen amLODipine (NORVASC) 10 MG tablet Take 1 tablet (10 mg total) by mouth daily.  30 tablet  6  . aspirin EC 81 MG tablet Take 1 tablet (81 mg total) by mouth daily.      Marland Kitchen atorvastatin (LIPITOR) 20 MG tablet Take 1 tablet (20 mg total) by mouth daily.  30 tablet  11  . clopidogrel (PLAVIX) 75 MG tablet Take 1 tablet (75 mg total) by mouth daily.  90 tablet  3  . hydrochlorothiazide (HYDRODIURIL) 25 MG tablet TAKE ONE TABLET BY MOUTH EVERY DAY  30 tablet  6  . lisinopril (PRINIVIL,ZESTRIL) 40 MG tablet TAKE ONE TABLET BY MOUTH EVERY DAY  30 tablet  5  . metoprolol (LOPRESSOR) 50 MG tablet Take 1 tablet (50 mg total) by mouth 2 (two) times daily.  60 tablet  12  . Multiple Vitamin (MULTIVITAMIN) capsule Take 1 capsule by mouth daily.          No Known Allergies  Past Medical History  Diagnosis Date  . Hypertension   . Coronary artery disease   . GERD (gastroesophageal reflux disease)   . Obesity     ROS: Negative except as per  HPI  BP 108/82  Pulse 83  Ht 5' 2"  (1.575 m)  Wt 103.475 kg (228 lb 1.9 oz)  BMI 41.72 kg/m2  PHYSICAL EXAM: Pt is alert and oriented, very pleasant, obese woman in NAD HEENT: normal Neck: JVP - normal, carotids 2+= without bruits Lungs: CTA bilaterally CV: RRR without murmur or gallop Abd: soft, NT, Positive BS, no hepatomegaly Ext: no C/C/E, distal pulses intact and equal Skin: warm/dry no rash  EKG:  Normal sinus rhythm, T wave abnormality consider inferior ischemia. T-Wave abnormality more significant than previous tracing.  ASSESSMENT AND PLAN:

## 2011-08-29 NOTE — Assessment & Plan Note (Signed)
Blood pressure is much better controlled with the addition of hydrochlorothiazide. She remains on a combination of amlodipine, HCTZ, lisinopril, and metoprolol. Recommend repeat metabolic panel in 6 months when she returns for followup.

## 2011-08-29 NOTE — Assessment & Plan Note (Signed)
The patient appears stable. She remains on dual antiplatelet therapy with aspirin and Plavix now over 3 years out from her 2 vessel PCI procedure. Her EKG raises concern with ischemic changes in the inferior leads. I have recommended a repeat exercise Myoview stress scan to rule out ischemia. We had along discussion about the importance of diet and exercise was continued focus on weight loss. The patient has lost 5 pounds since her last visit here in the summer.

## 2011-09-04 ENCOUNTER — Ambulatory Visit (HOSPITAL_COMMUNITY): Payer: BC Managed Care – PPO | Attending: Cardiovascular Disease | Admitting: Radiology

## 2011-09-04 DIAGNOSIS — E669 Obesity, unspecified: Secondary | ICD-10-CM | POA: Insufficient documentation

## 2011-09-04 DIAGNOSIS — I2510 Atherosclerotic heart disease of native coronary artery without angina pectoris: Secondary | ICD-10-CM

## 2011-09-04 DIAGNOSIS — E785 Hyperlipidemia, unspecified: Secondary | ICD-10-CM

## 2011-09-04 DIAGNOSIS — R0609 Other forms of dyspnea: Secondary | ICD-10-CM | POA: Insufficient documentation

## 2011-09-04 DIAGNOSIS — R002 Palpitations: Secondary | ICD-10-CM | POA: Insufficient documentation

## 2011-09-04 DIAGNOSIS — R0602 Shortness of breath: Secondary | ICD-10-CM

## 2011-09-04 DIAGNOSIS — I10 Essential (primary) hypertension: Secondary | ICD-10-CM

## 2011-09-04 DIAGNOSIS — J45909 Unspecified asthma, uncomplicated: Secondary | ICD-10-CM | POA: Insufficient documentation

## 2011-09-04 DIAGNOSIS — R9431 Abnormal electrocardiogram [ECG] [EKG]: Secondary | ICD-10-CM | POA: Insufficient documentation

## 2011-09-04 DIAGNOSIS — R000 Tachycardia, unspecified: Secondary | ICD-10-CM | POA: Insufficient documentation

## 2011-09-04 DIAGNOSIS — Z87891 Personal history of nicotine dependence: Secondary | ICD-10-CM | POA: Insufficient documentation

## 2011-09-04 DIAGNOSIS — R0989 Other specified symptoms and signs involving the circulatory and respiratory systems: Secondary | ICD-10-CM | POA: Insufficient documentation

## 2011-09-04 DIAGNOSIS — R079 Chest pain, unspecified: Secondary | ICD-10-CM

## 2011-09-04 MED ORDER — TECHNETIUM TC 99M TETROFOSMIN IV KIT
33.0000 | PACK | Freq: Once | INTRAVENOUS | Status: AC | PRN
  Administered 2011-09-04: 33 via INTRAVENOUS

## 2011-09-04 MED ORDER — TECHNETIUM TC 99M TETROFOSMIN IV KIT
11.0000 | PACK | Freq: Once | INTRAVENOUS | Status: AC | PRN
  Administered 2011-09-04: 11 via INTRAVENOUS

## 2011-09-04 NOTE — Progress Notes (Signed)
[facility] [place] [address] [phone]  Cardiology Nuclear Med Study  [name] is a 36 y.o. female [id number] Aug 08, 1975   Nuclear Med Background Indication for Stress Test:  Evaluation for Ischemia, Stent Patency and Abnormal EKG-More Significant T-wave Changes History:  Asthma and '09 NSTEMI>Stent-RCA/CFX, EF=60%; '10 JGO:TLXB infero-basal ischemia/breast attenuation, EF=65% Cardiac Risk Factors: History of Smoking, Hypertension, Lipids and Obesity  Symptoms:  DOE and Rapid HR   Nuclear Pre-Procedure Caffeine/Decaff Intake:  None NPO After: 8:00pm   Lungs:  Clear.   IV 0.9% NS with Angio Cath:  20g  IV Site: R Antecubital  IV Started by:  Eliezer Lofts, EMT-P  Chest Size (in):  40 Cup Size: DD  Height: 5' 2"  (1.575 m)  Weight:  224 lb (101.606 kg)  BMI:  Body mass index is 40.97 kg/(m^2). Tech Comments:  Metoprolol held > 24 hours, per patient.    Nuclear Med Study 1 or 2 day study: 1 day  Stress Test Type:  Stress  Reading MD: Jenkins Rouge, MD  Order Authorizing Provider:  Sherren Mocha, MD  Resting Radionuclide: Technetium 35mTetrofosmin  Resting Radionuclide Dose: 11.0 mCi   Stress Radionuclide:  Technetium 934metrofosmin  Stress Radionuclide Dose: 33.0 mCi           Stress Protocol Rest HR: 79 Stress HR: 164  Rest BP: Sitting 102/73  Standing 121/86 Stress BP: 154/88  Exercise Time (min): 6:00 METS: 7.0   Predicted Max HR: 184 bpm % Max HR: 89.13 bpm Rate Pressure Product: 25256   Dose of Adenosine (mg):  n/a Dose of Lexiscan: n/a mg  Dose of Atropine (mg): n/a Dose of Dobutamine: n/a mcg/kg/min (at max HR)  Stress Test Technologist: ShLetta MoynahanCMA-N  Nuclear Technologist:  StCharlton AmorCNMT     Rest Procedure:  Myocardial perfusion imaging was performed at rest 45 minutes following the intravenous administration of Technetium 99105mtrofosmin.  Rest ECG: Nonspecific T-wave  changes.  Stress Procedure:  The patient exercised for six minutes on the treadmill utilizing the Bruce protocol.  The patient stopped due to fatigue and shortness of breath.  She denied any chest pain.  There were no diagnostic ST-T wave changes, only occasional PVC's.  Technetium 61m79mrofosmin was injected at peak exercise and myocardial perfusion imaging was performed after a brief delay.  Stress ECG: No significant change from baseline ECG  QPS Raw Data Images:  Normal; no motion artifact; normal heart/lung ratio. Stress Images:  Normal homogeneous uptake in all areas of the myocardium. Rest Images:  Normal homogeneous uptake in all areas of the myocardium. Subtraction (SDS):  Normal Transient Ischemic Dilatation (Normal <1.22):  0.90 Lung/Heart Ratio (Normal <0.45):  0.33  Quantitative Gated Spect Images QGS EDV:  60 ml QGS ESV:  11 ml QGS cine images:  NL LV Function; NL Wall Motion QGS EF: 82%  Impression Exercise Capacity:  Fair exercise capacity. BP Response:  Normal blood pressure response. Clinical Symptoms:  No chest pain. ECG Impression:  No significant ST segment change suggestive of ischemia. Comparison with Prior Nuclear Study: No images to compare  Overall Impression:  Normal stress nuclear study.  PeteJenkins Rouge

## 2011-11-15 ENCOUNTER — Other Ambulatory Visit: Payer: Self-pay | Admitting: Cardiovascular Disease

## 2011-12-24 ENCOUNTER — Other Ambulatory Visit: Payer: Self-pay | Admitting: Cardiovascular Disease

## 2011-12-24 NOTE — Telephone Encounter (Signed)
Fax Received. Refill Completed. [name] (R.M.A)   

## 2012-01-10 ENCOUNTER — Other Ambulatory Visit: Payer: Self-pay | Admitting: Cardiovascular Disease
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
## 2012-02-11 ENCOUNTER — Other Ambulatory Visit: Payer: Self-pay | Admitting: Cardiovascular Disease

## 2012-02-13 ENCOUNTER — Telehealth: Payer: Self-pay | Admitting: Cardiovascular Disease

## 2012-02-13 NOTE — Telephone Encounter (Signed)
Please return call to patient at [phone] regarding medication.

## 2012-02-13 NOTE — Telephone Encounter (Signed)
Patient would like to know if it is okay for her to take HCG activator (diet pill) from [facility] store. She wants to make sure it won't interfere with the heart  medications she is taken.

## 2012-02-19 NOTE — Telephone Encounter (Signed)
I spoke with the pt and made her aware of Dr Antionette Char recommendation.  The pt said she has already decided not to take a diet pill.

## 2012-02-19 NOTE — Telephone Encounter (Signed)
I don't know anything about this diet supplement. In general I recommend avoiding them as they are not safe in patient's with CAD.

## 2012-03-02 ENCOUNTER — Other Ambulatory Visit: Payer: Self-pay | Admitting: Cardiovascular Disease

## 2012-04-06 ENCOUNTER — Ambulatory Visit (INDEPENDENT_AMBULATORY_CARE_PROVIDER_SITE_OTHER): Payer: BC Managed Care – PPO | Admitting: Cardiovascular Disease

## 2012-04-06 ENCOUNTER — Encounter: Payer: Self-pay | Admitting: Cardiovascular Disease

## 2012-04-06 VITALS — BP 110/68 | HR 84 | Ht 61.5 in | Wt 235.4 lb

## 2012-04-06 DIAGNOSIS — I10 Essential (primary) hypertension: Secondary | ICD-10-CM

## 2012-04-06 DIAGNOSIS — E785 Hyperlipidemia, unspecified: Secondary | ICD-10-CM

## 2012-04-06 DIAGNOSIS — I2510 Atherosclerotic heart disease of native coronary artery without angina pectoris: Secondary | ICD-10-CM

## 2012-04-06 NOTE — Progress Notes (Signed)
   HPI:  Delightful 36 year old woman presenting for followup evaluation. The patient has coronary artery disease and she initially presented with acute coronary syndrome in 2009. She underwent stenting of the right coronary artery and left circumflex vessels. Her left ventricular function has been normal. She underwent a stress test earlier this year that showed no ischemia. Her left ventricular ejection fraction has been normal. She has no complaints today. She's concerned about her weight and she's had a difficult time with weight loss. She tells me that she goes out to eat too much. She specifically denies chest pain, chest pressure, dyspnea, edema, or palpitations. She walks for exercise up to 1 hour daily. She has paperwork from the Department [employer] and needs to be filled out today.  Outpatient Encounter Prescriptions as of 04/06/2012  Medication Sig Dispense Refill  . amLODipine (NORVASC) 10 MG tablet TAKE ONE TABLET BY MOUTH EVERY DAY  30 tablet  5  . aspirin EC 81 MG tablet Take 1 tablet (81 mg total) by mouth daily.      Marland Kitchen atorvastatin (LIPITOR) 20 MG tablet TAKE ONE TABLET BY MOUTH DAILY  30 tablet  6  . clopidogrel (PLAVIX) 75 MG tablet TAKE 1 TABLET (75 MG TOTAL) DAILY  90 tablet  3  . hydrochlorothiazide (HYDRODIURIL) 25 MG tablet TAKE ONE TABLET BY MOUTH EVERY DAY  30 tablet  11  . lisinopril (PRINIVIL,ZESTRIL) 40 MG tablet TAKE ONE TABLET BY MOUTH EVERY DAY  30 tablet  6  . metoprolol (LOPRESSOR) 50 MG tablet TAKE ONE TABLET BY MOUTH TWICE DAILY  60 tablet  11  . Multiple Vitamin (MULTIVITAMIN) capsule Take 1 capsule by mouth daily.        Marland Kitchen DISCONTD: albuterol (VENTOLIN HFA) 108 (90 BASE) MCG/ACT inhaler Inhale 2 puffs into the lungs as needed.  3.7 g  3    No Known Allergies  Past Medical History  Diagnosis Date  . Hypertension   . Coronary artery disease   . GERD (gastroesophageal reflux disease)   . Obesity     ROS: Negative except as per HPI  BP 110/68   Pulse 84  Ht 5' 1.5" (1.562 m)  Wt 106.777 kg (235 lb 6.4 oz)  BMI 43.76 kg/m2  PHYSICAL EXAM: Pt is alert and oriented, very pleasant obese woman in NAD HEENT: normal Neck: JVP - normal, carotids 2+= without bruits Lungs: CTA bilaterally CV: RRR without murmur or gallop Abd: soft, NT, Positive BS, no hepatomegaly Ext: no C/C/E, distal pulses intact and equal Skin: warm/dry no rash  EKG:  Normal sinus rhythm 84 beats per minute, within normal limits.  ASSESSMENT AND PLAN:

## 2012-04-06 NOTE — Assessment & Plan Note (Signed)
The patient is on atorvastatin 20 mg. She is due for lipids and LFTs. Her last lipids were reviewed and they were checked about one year ago. LDL cholesterol was 68.

## 2012-04-06 NOTE — Assessment & Plan Note (Signed)
The patient is stable without anginal symptoms. She will continue her current medical program. There is no contraindication to her driving. Her recent nuclear stress test was within normal limits.

## 2012-04-06 NOTE — Patient Instructions (Signed)
Your physician wants you to follow-up in: 1 YEAR with Dr Burt Knack.  You will receive a reminder letter in the mail two months in advance. If you don't receive a letter, please call our office to schedule the follow-up appointment.  Your physician recommends that you return for a FASTING LIPID and LIVER--nothing to eat or drink after midnight, lab opens at 8:30  Your physician recommends that you continue on your current medications as directed. Please refer to the Current Medication list given to you today.

## 2012-04-06 NOTE — Assessment & Plan Note (Signed)
Blood pressure is under optimal control. We'll continue combination of amlodipine, hydrochlorothiazide, lisinopril, and metoprolol.

## 2012-04-26 ENCOUNTER — Other Ambulatory Visit (INDEPENDENT_AMBULATORY_CARE_PROVIDER_SITE_OTHER): Payer: BC Managed Care – PPO

## 2012-04-26 DIAGNOSIS — I10 Essential (primary) hypertension: Secondary | ICD-10-CM

## 2012-04-26 DIAGNOSIS — E785 Hyperlipidemia, unspecified: Secondary | ICD-10-CM

## 2012-04-26 DIAGNOSIS — I2510 Atherosclerotic heart disease of native coronary artery without angina pectoris: Secondary | ICD-10-CM

## 2012-04-27 LAB — HEPATIC FUNCTION PANEL
ALT: 23 U/L (ref 0–35)
AST: 23 U/L (ref 0–37)
Albumin: 4 g/dL (ref 3.5–5.2)
Total Bilirubin: 0.5 mg/dL (ref 0.3–1.2)

## 2012-04-27 LAB — LIPID PANEL
HDL: 35.1 mg/dL — ABNORMAL LOW (ref >39.00)
Total CHOL/HDL Ratio: 4
Triglycerides: 193 mg/dL — ABNORMAL HIGH (ref 0.0–149.0)
VLDL: 38.6 mg/dL (ref 0.0–40.0)

## 2012-06-08 ENCOUNTER — Other Ambulatory Visit: Payer: Self-pay | Admitting: *Deleted

## 2012-06-08 MED ORDER — LISINOPRIL 40 MG PO TABS: 40.0000 mg | ORAL_TABLET | Freq: Every day | ORAL | Status: DC

## 2012-06-08 MED ORDER — AMLODIPINE BESYLATE 10 MG PO TABS: 10.0000 mg | ORAL_TABLET | Freq: Every day | ORAL | Status: DC

## 2012-09-23 ENCOUNTER — Other Ambulatory Visit: Payer: Self-pay | Admitting: Cardiovascular Disease

## 2012-11-23 ENCOUNTER — Other Ambulatory Visit: Payer: Self-pay | Admitting: Family Medicine

## 2012-11-23 ENCOUNTER — Other Ambulatory Visit (HOSPITAL_COMMUNITY)
Admission: RE | Admit: 2012-11-23 | Discharge: 2012-11-23 | Disposition: A | Discharge status: Home / Self Care | Payer: BC Managed Care – PPO | Source: Ambulatory Visit | Attending: Family Medicine | Admitting: Family Medicine

## 2012-11-23 DIAGNOSIS — Z1151 Encounter for screening for human papillomavirus (HPV): Secondary | ICD-10-CM | POA: Insufficient documentation

## 2012-11-23 DIAGNOSIS — Z01419 Encounter for gynecological examination (general) (routine) without abnormal findings: Secondary | ICD-10-CM | POA: Insufficient documentation

## 2012-12-15 ENCOUNTER — Other Ambulatory Visit: Payer: Self-pay | Admitting: Cardiovascular Disease

## 2012-12-15 ENCOUNTER — Other Ambulatory Visit: Payer: Self-pay

## 2012-12-15 MED ORDER — CLOPIDOGREL BISULFATE 75 MG PO TABS: ORAL_TABLET | ORAL | Status: DC

## 2013-01-10 ENCOUNTER — Other Ambulatory Visit: Payer: Self-pay | Admitting: Cardiovascular Disease

## 2013-01-15 ENCOUNTER — Other Ambulatory Visit: Payer: Self-pay | Admitting: Cardiovascular Disease

## 2013-01-20 ENCOUNTER — Other Ambulatory Visit: Payer: Self-pay | Admitting: Cardiovascular Disease

## 2013-02-12 ENCOUNTER — Other Ambulatory Visit: Payer: Self-pay | Admitting: Cardiovascular Disease

## 2013-02-17 ENCOUNTER — Other Ambulatory Visit: Payer: Self-pay | Admitting: Cardiovascular Disease

## 2013-02-18 ENCOUNTER — Other Ambulatory Visit: Payer: Self-pay | Admitting: Cardiovascular Disease

## 2013-02-24 ENCOUNTER — Telehealth: Payer: Self-pay | Admitting: Cardiovascular Disease

## 2013-02-24 NOTE — Telephone Encounter (Signed)
New Prob  Pt is having concerns regarding taking the lisinopril, she said she has been on it for several years but after seeing a commercial and an allergic reaction her friend had she is unsure it is safe for her.

## 2013-02-24 NOTE — Telephone Encounter (Signed)
No concern if she's been taking without problems

## 2013-02-24 NOTE — Telephone Encounter (Signed)
Pt concerned with using lisinopril. Friend had an allergic reaction to it and she is now fearful/ offered reassurance but would like your input on the med. She is feeling fine no c/o medication, just unsure if she should take the med. Pt told I will call her back tomorrow after reviewed with Dr.

## 2013-02-25 NOTE — Telephone Encounter (Signed)
Pt informed

## 2013-04-06 ENCOUNTER — Other Ambulatory Visit: Payer: Self-pay | Admitting: Cardiovascular Disease

## 2013-04-19 ENCOUNTER — Other Ambulatory Visit: Payer: Self-pay | Admitting: Cardiovascular Disease

## 2013-04-29 ENCOUNTER — Ambulatory Visit (INDEPENDENT_AMBULATORY_CARE_PROVIDER_SITE_OTHER): Payer: BC Managed Care – PPO | Admitting: Cardiology

## 2013-04-29 ENCOUNTER — Encounter: Payer: Self-pay | Admitting: Cardiology

## 2013-04-29 VITALS — BP 94/74 | HR 75 | Ht 61.5 in | Wt 227.8 lb

## 2013-04-29 DIAGNOSIS — I2510 Atherosclerotic heart disease of native coronary artery without angina pectoris: Secondary | ICD-10-CM

## 2013-04-29 DIAGNOSIS — I10 Essential (primary) hypertension: Secondary | ICD-10-CM

## 2013-04-29 NOTE — Patient Instructions (Addendum)
Your physician wants you to follow-up in: [place] will receive a reminder letter in the mail two months in advance. If you don't receive a letter, please call our office to schedule the follow-up appointment.

## 2013-04-29 NOTE — Progress Notes (Signed)
CARDIOLOGY OFFICE NOTE  Patient ID: [name] MRN: [id number], DOB/AGE: [birth year]   Date of Visit: 04/29/2013  Primary Physician: Harvie Junior, MD Primary Cardiologist: Sherren Mocha, MD Reason for Visit: Routine follow-up for CAD  History of Present Illness  [name] is a 37 y.o. female with CAD who presents for routine cardiology follow-up. She initially presented with acute coronary syndrome in 2009. She underwent stenting of the right coronary artery and left circumflex vessels. Her left ventricular function has been normal. She underwent a stress test Jan 2013 which was negative for ischemia with normal LVEF.   Since last being seen in our clinic, she reports she is doing well and has no complaints. She is now exercising regulary at [employer] 5 times weekly. She does an hour of cardio then some light weight training. She has stopped drinking soda and is making healthier food choices. She reports some weight loss which she is thrilled about and is "feeling great." She denies chest pain or shortness of breath. She denies palpitations, dizziness, near syncope or syncope. She denies LE swelling, orthopnea, PND or recent weight gain. She is compliant with her medications.  Past Medical History Past Medical History  Diagnosis Date  . Hypertension   . Coronary artery disease   . GERD (gastroesophageal reflux disease)   . Obesity     Past Surgical History Past Surgical History  Procedure Laterality Date  . Cardiac catheterization      Promus drug-eluting stent to the OM 1    Allergies/Intolerances No Known Allergies  Current Home Medications Current Outpatient Prescriptions  Medication Sig Dispense Refill  . amLODipine (NORVASC) 10 MG tablet TAKE ONE TABLET BY MOUTH ONCE DAILY  30 tablet  1  . aspirin EC 81 MG tablet Take 1 tablet (81 mg total) by mouth daily.      Marland Kitchen atorvastatin (LIPITOR) 20 MG tablet TAKE ONE TABLET BY MOUTH EVERY DAY  30 tablet   6  . clopidogrel (PLAVIX) 75 MG tablet TAKE ONE TABLET BY MOUTH ONCE DAILY  90 tablet  0  . hydrochlorothiazide (HYDRODIURIL) 25 MG tablet TAKE ONE TABLET BY MOUTH EVERY DAY  30 tablet  6  . lisinopril (PRINIVIL,ZESTRIL) 40 MG tablet TAKE ONE TABLET BY MOUTH EVERY DAY  30 tablet  3  . metoprolol (LOPRESSOR) 50 MG tablet TAKE ONE TABLET BY MOUTH TWICE DAILY  60 tablet  6  . Multiple Vitamin (MULTIVITAMIN) capsule Take 1 capsule by mouth daily.         No current facility-administered medications for this visit.    Social History Social History  . Marital Status: Single   Social History Main Topics  . Smoking status: Former Research scientist (life sciences)  . Smokeless tobacco: No     Comment: 10 packs/year  . Alcohol Use: Yes     Comment: rarely   . 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 A 10-pack-year history of smoking, still smoking. Occasional alcohol use. No drug use.     Review of Systems General: No chills, fever, night sweats or weight changes Cardiovascular: No chest pain, dyspnea on exertion, edema, orthopnea, palpitations, paroxysmal nocturnal dyspnea Dermatological: No rash, lesions or masses Respiratory: No cough, dyspnea Urologic: No hematuria, dysuria Abdominal: No nausea, vomiting, diarrhea, bright red blood per rectum, melena, or hematemesis Neurologic: No visual changes, weakness, changes in mental status All other systems reviewed and are otherwise negative except as  noted above.  Physical Exam Vitals: Blood pressure 94/74, pulse 75, height 5' 1.5" (1.562 m), weight 227 lb 12.8 oz (103.329 kg), SpO2 98.00%.  General: Well developed, well appearing 37 y.o. female in no acute distress. HEENT: Normocephalic, atraumatic. EOMs intact. Sclera nonicteric. Oropharynx clear.  Neck: Supple. No JVD. Lungs: Respirations regular and unlabored, CTA bilaterally. No wheezes, rales or rhonchi. Heart: RRR. S1, S2 present. No murmurs, rub, S3 or  S4. Abdomen: Soft, non-distended.  Extremities: No clubbing, cyanosis or edema. PT/Radials 2+ and equal bilaterally. Psych: Normal affect. Neuro: Alert and oriented X 3. Moves all extremities spontaneously.   Assessment and Plan 1. CAD - stable without anginal symptoms - continue medical therapy - counseled / congratulated her regarding routine exercise regimen, better lifestyle choices and weight loss; encouraged her to keep up the good work - return for follow-up with Dr. Burt Knack in one year 2. HTN - well controlled - no changes made today  Signed, Ileene Hutchinson, PA-C 04/29/2013, 12:23 PM

## 2013-05-11 ENCOUNTER — Other Ambulatory Visit: Payer: Self-pay | Admitting: Cardiovascular Disease

## 2013-05-15 ENCOUNTER — Other Ambulatory Visit: Payer: Self-pay | Admitting: Cardiovascular Disease

## 2013-06-11 ENCOUNTER — Other Ambulatory Visit: Payer: Self-pay | Admitting: Cardiovascular Disease

## 2013-06-14 ENCOUNTER — Other Ambulatory Visit: Payer: Self-pay | Admitting: Cardiovascular Disease

## 2013-07-04 ENCOUNTER — Telehealth: Payer: Self-pay | Admitting: *Deleted

## 2013-07-04 MED ORDER — CLOPIDOGREL BISULFATE 75 MG PO TABS: 75.0000 mg | ORAL_TABLET | Freq: Once | ORAL | Status: DC

## 2013-07-04 MED ORDER — METOPROLOL TARTRATE 50 MG PO TABS: 50.0000 mg | ORAL_TABLET | Freq: Two times a day (BID) | ORAL | Status: DC

## 2013-07-04 MED ORDER — LISINOPRIL 40 MG PO TABS: 40.0000 mg | ORAL_TABLET | Freq: Every day | ORAL | Status: DC

## 2013-07-04 MED ORDER — HYDROCHLOROTHIAZIDE 25 MG PO TABS: 25.0000 mg | ORAL_TABLET | Freq: Every day | ORAL | Status: DC

## 2013-07-04 NOTE — Telephone Encounter (Signed)
Wants 90 day refills

## 2013-07-20 ENCOUNTER — Emergency Department (INDEPENDENT_AMBULATORY_CARE_PROVIDER_SITE_OTHER)
Admission: EM | Admit: 2013-07-20 | Discharge: 2013-07-20 | Disposition: A | Discharge status: Home / Self Care | Payer: BC Managed Care – PPO | Source: Home / Self Care | Attending: Family Medicine | Admitting: Family Medicine

## 2013-07-20 ENCOUNTER — Encounter (HOSPITAL_COMMUNITY): Payer: Self-pay | Admitting: Emergency Medicine

## 2013-07-20 DIAGNOSIS — K089 Disorder of teeth and supporting structures, unspecified: Secondary | ICD-10-CM

## 2013-07-20 DIAGNOSIS — K1321 Leukoplakia of oral mucosa, including tongue: Secondary | ICD-10-CM

## 2013-07-20 DIAGNOSIS — K0889 Other specified disorders of teeth and supporting structures: Secondary | ICD-10-CM

## 2013-07-20 MED ORDER — DICLOFENAC POTASSIUM 50 MG PO TABS: 50.0000 mg | ORAL_TABLET | Freq: Three times a day (TID) | ORAL | Status: DC

## 2013-07-20 MED ORDER — CLINDAMYCIN HCL 300 MG PO CAPS: 300.0000 mg | ORAL_CAPSULE | Freq: Three times a day (TID) | ORAL | Status: DC

## 2013-07-20 NOTE — ED Provider Notes (Signed)
CSN: [id number]     Arrival date & time 07/20/13  1904 History   First MD Initiated Contact with Patient 07/20/13 2007     Chief Complaint  Patient presents with  . Dental Pain   (Consider location/radiation/quality/duration/timing/severity/associated sxs/prior Treatment) Patient is a 37 y.o. female presenting with tooth pain. The history is provided by the patient.  Dental Pain Location:  Upper Upper teeth location:  1/RU 3rd molar Quality:  Throbbing Severity:  Moderate Onset quality:  Gradual Duration:  3 days Timing:  Constant Progression:  Worsening Context: dental caries and poor dentition   Associated symptoms: oral lesions   Risk factors: lack of dental care, periodontal disease and smoking     Past Medical History  Diagnosis Date  . Hypertension   . Coronary artery disease   . GERD (gastroesophageal reflux disease)   . Obesity    Past Surgical History  Procedure Laterality Date  . Cardiac catheterization      Promus drug-eluting stent to the OM 1   History reviewed. No pertinent family history. History  Substance Use Topics  . Smoking status: Former Research scientist (life sciences)  . Smokeless tobacco: Not on file     Comment: 10 packs/year  . Alcohol Use: Yes     Comment: rarely    OB History   Grav Para Term Preterm Abortions TAB SAB Ect Mult Living                 Review of Systems  Constitutional: Negative.   HENT: Positive for dental problem and mouth sores.     Allergies  Review of patient's allergies indicates no known allergies.  Home Medications   Current Outpatient Rx  Name  Route  Sig  Dispense  Refill  . amLODipine (NORVASC) 10 MG tablet      TAKE ONE TABLET BY MOUTH ONCE DAILY   30 tablet   1     .Marland KitchenPatient needs to contact office to schedule a fo ...   . aspirin EC 81 MG tablet   Oral   Take 1 tablet (81 mg total) by mouth daily.         Marland Kitchen atorvastatin (LIPITOR) 20 MG tablet      TAKE ONE TABLET BY MOUTH ONCE DAILY   30 tablet   5   .  clindamycin (CLEOCIN) 300 MG capsule   Oral   Take 1 capsule (300 mg total) by mouth 3 (three) times daily.   21 capsule   0   . clopidogrel (PLAVIX) 75 MG tablet   Oral   Take 1 tablet (75 mg total) by mouth once.   90 tablet   3   . diclofenac (CATAFLAM) 50 MG tablet   Oral   Take 1 tablet (50 mg total) by mouth 3 (three) times daily.   21 tablet   0   . hydrochlorothiazide (HYDRODIURIL) 25 MG tablet   Oral   Take 1 tablet (25 mg total) by mouth daily.   90 tablet   3   . lisinopril (PRINIVIL,ZESTRIL) 40 MG tablet   Oral   Take 1 tablet (40 mg total) by mouth daily.   90 tablet   3   . metoprolol (LOPRESSOR) 50 MG tablet   Oral   Take 1 tablet (50 mg total) by mouth 2 (two) times daily.   180 tablet   3   . Multiple Vitamin (MULTIVITAMIN) capsule   Oral   Take 1 capsule by mouth daily.  BP 118/77  Pulse 93  Temp(Src) 99.5 F (37.5 C) (Oral)  Resp 20  SpO2 100%  LMP 07/20/2013 Physical Exam  Nursing note and vitals reviewed. Constitutional: She is oriented to person, place, and time. She appears well-developed and well-nourished.  HENT:  Right Ear: External ear normal.  Left Ear: External ear normal.  Mouth/Throat: Oral lesions present.    Neck: Normal range of motion. Neck supple.  Neurological: She is alert and oriented to person, place, and time.  Skin: Skin is warm and dry.    ED Course  Procedures (including critical care time) Labs Review Labs Reviewed - No data to display Imaging Review No results found.  EKG Interpretation    Date/Time:    Ventricular Rate:    PR Interval:    QRS Duration:   QT Interval:    QTC Calculation:   R Axis:     Text Interpretation:              MDM   1. Pain, dental   2. Leukoplakia of oral mucosa        Billy Fischer, MD 07/21/13 [phone]

## 2013-07-20 NOTE — ED Notes (Signed)
Toothache, onset Monday, seen by provider prior to this nurse

## 2013-07-21 ENCOUNTER — Other Ambulatory Visit: Payer: Self-pay | Admitting: *Deleted

## 2013-07-21 MED ORDER — ATORVASTATIN CALCIUM 20 MG PO TABS: ORAL_TABLET | ORAL | Status: DC

## 2013-08-23 ENCOUNTER — Encounter (HOSPITAL_COMMUNITY): Payer: Self-pay | Admitting: Emergency Medicine

## 2013-08-23 ENCOUNTER — Emergency Department (HOSPITAL_COMMUNITY)
Admission: EM | Admit: 2013-08-23 | Discharge: 2013-08-24 | Disposition: A | Discharge status: Home / Self Care | Payer: BC Managed Care – PPO | Attending: Emergency Medicine | Admitting: Emergency Medicine

## 2013-08-23 DIAGNOSIS — E669 Obesity, unspecified: Secondary | ICD-10-CM | POA: Insufficient documentation

## 2013-08-23 DIAGNOSIS — I252 Old myocardial infarction: Secondary | ICD-10-CM | POA: Insufficient documentation

## 2013-08-23 DIAGNOSIS — I10 Essential (primary) hypertension: Secondary | ICD-10-CM | POA: Insufficient documentation

## 2013-08-23 DIAGNOSIS — I2510 Atherosclerotic heart disease of native coronary artery without angina pectoris: Secondary | ICD-10-CM | POA: Insufficient documentation

## 2013-08-23 DIAGNOSIS — Z792 Long term (current) use of antibiotics: Secondary | ICD-10-CM | POA: Insufficient documentation

## 2013-08-23 DIAGNOSIS — Z791 Long term (current) use of non-steroidal anti-inflammatories (NSAID): Secondary | ICD-10-CM | POA: Insufficient documentation

## 2013-08-23 DIAGNOSIS — Z7982 Long term (current) use of aspirin: Secondary | ICD-10-CM | POA: Insufficient documentation

## 2013-08-23 DIAGNOSIS — R079 Chest pain, unspecified: Secondary | ICD-10-CM

## 2013-08-23 DIAGNOSIS — K219 Gastro-esophageal reflux disease without esophagitis: Secondary | ICD-10-CM | POA: Insufficient documentation

## 2013-08-23 DIAGNOSIS — Z7902 Long term (current) use of antithrombotics/antiplatelets: Secondary | ICD-10-CM | POA: Insufficient documentation

## 2013-08-23 DIAGNOSIS — Z95818 Presence of other cardiac implants and grafts: Secondary | ICD-10-CM | POA: Insufficient documentation

## 2013-08-23 DIAGNOSIS — Z87891 Personal history of nicotine dependence: Secondary | ICD-10-CM | POA: Insufficient documentation

## 2013-08-23 DIAGNOSIS — Z79899 Other long term (current) drug therapy: Secondary | ICD-10-CM | POA: Insufficient documentation

## 2013-08-23 HISTORY — DX: Acute myocardial infarction, unspecified: I21.9

## 2013-08-23 NOTE — ED Notes (Signed)
Patient is alert and oriented x3.  She is complaining of left side chest pain that started 3 days ago.  She states that she can feel all her food going down when she swallows.  She adds that she also  Has acid reflux along with a MI.  Currently she denies any pain.

## 2013-08-24 ENCOUNTER — Emergency Department (HOSPITAL_COMMUNITY): Payer: BC Managed Care – PPO

## 2013-08-24 MED ORDER — ESOMEPRAZOLE MAGNESIUM 40 MG PO CPDR: 40.0000 mg | DELAYED_RELEASE_CAPSULE | Freq: Two times a day (BID) | ORAL | Status: DC

## 2013-08-24 NOTE — ED Provider Notes (Signed)
CSN: [id number]     Arrival date & time 08/23/13  2201 History   First MD Initiated Contact with Patient 08/23/13 2313     Chief Complaint  Patient presents with  . Chest Pain    HPI Patient reports discomfort and painful swallowing over the past several days.  She now has some indigestion and discomfort in her left arm.  She denies chest pain or shortness of breath.  She states she can't feel her through as it goes down her esophagus.  She denies nausea and vomiting.  She has no complaints to suggest impaction.  No fevers or chills.  No abdominal pain.  No prior endoscopy.  Patient states she's been taking Nexium once a day.  Her discomfort is mild to moderate in severity and worse with swallowing.  At this time because she's not swallowing she does not have any significant discomfort.   Past Medical History  Diagnosis Date  . Hypertension   . Coronary artery disease   . GERD (gastroesophageal reflux disease)   . Obesity   . MI (myocardial infarction)     Nov 2010   Past Surgical History  Procedure Laterality Date  . Cardiac catheterization      Promus drug-eluting stent to the OM 1   History reviewed. No pertinent family history. History  Substance Use Topics  . Smoking status: Former Research scientist (life sciences)  . Smokeless tobacco: Not on file     Comment: 10 packs/year  . Alcohol Use: Yes     Comment: rarely    OB History   Grav Para Term Preterm Abortions TAB SAB Ect Mult Living                 Review of Systems  All other systems reviewed and are negative.    Allergies  Review of patient's allergies indicates no known allergies.  Home Medications   Current Outpatient Rx  Name  Route  Sig  Dispense  Refill  . amLODipine (NORVASC) 10 MG tablet   Oral   Take 10 mg by mouth daily.         Marland Kitchen aspirin EC 81 MG tablet   Oral   Take 1 tablet (81 mg total) by mouth daily.         Marland Kitchen atorvastatin (LIPITOR) 20 MG tablet   Oral   Take 20 mg by mouth daily.         .  clindamycin (CLEOCIN) 300 MG capsule   Oral   Take 1 capsule (300 mg total) by mouth 3 (three) times daily.   21 capsule   0   . clopidogrel (PLAVIX) 75 MG tablet   Oral   Take 1 tablet (75 mg total) by mouth once.   90 tablet   3   . diclofenac (CATAFLAM) 50 MG tablet   Oral   Take 1 tablet (50 mg total) by mouth 3 (three) times daily.   21 tablet   0   . hydrochlorothiazide (HYDRODIURIL) 25 MG tablet   Oral   Take 1 tablet (25 mg total) by mouth daily.   90 tablet   3   . lisinopril (PRINIVIL,ZESTRIL) 40 MG tablet   Oral   Take 1 tablet (40 mg total) by mouth daily.   90 tablet   3   . metoprolol (LOPRESSOR) 50 MG tablet   Oral   Take 1 tablet (50 mg total) by mouth 2 (two) times daily.   180 tablet   3   .  Multiple Vitamin (MULTIVITAMIN) capsule   Oral   Take 1 capsule by mouth daily.           Marland Kitchen esomeprazole (NEXIUM) 40 MG capsule   Oral   Take 1 capsule (40 mg total) by mouth 2 (two) times daily before a meal.   60 capsule   0    BP 115/91  Pulse 64  Temp(Src) 98.4 F (36.9 C) (Oral)  Resp 13  Ht 5' 1"  (1.549 m)  Wt 228 lb (103.42 kg)  BMI 43.10 kg/m2  SpO2 99%  LMP 08/16/2013 Physical Exam  Nursing note and vitals reviewed. Constitutional: She is oriented to person, place, and time. She appears well-developed and well-nourished. No distress.  HENT:  Head: Normocephalic and atraumatic.  Eyes: EOM are normal.  Neck: Normal range of motion.  Cardiovascular: Normal rate, regular rhythm and normal heart sounds.   Pulmonary/Chest: Effort normal and breath sounds normal.  Abdominal: Soft. She exhibits no distension. There is no tenderness.  Musculoskeletal: Normal range of motion.  Neurological: She is alert and oriented to person, place, and time.  Skin: Skin is warm and dry.  Psychiatric: She has a normal mood and affect. Judgment normal.    ED Course  Procedures (including critical care time)  ECG interpretation  Date: 08/24/2013   Rate: 67  Rhythm: normal sinus rhythm  QRS Axis: normal  Intervals: normal  ST/T Wave abnormalities: normal  Conduction Disutrbances: none  Narrative Interpretation:   Old EKG Reviewed: No significant changes noted     Labs Review Labs Reviewed - No data to display Imaging Review Dg Chest 2 View  08/24/2013   CLINICAL DATA:  Chest pain for several days.  EXAM: CHEST  2 VIEW  COMPARISON:  Chest radiograph performed 11/22/2010  FINDINGS: The lungs are well-aerated and clear. There is no evidence of focal opacification, pleural effusion or pneumothorax.  The heart is normal in size; the mediastinal contour is within normal limits. No acute osseous abnormalities are seen.  IMPRESSION: No acute cardiopulmonary process seen.   Electronically Signed   By: Garald Balding M.D.   On: 08/24/2013 01:03  I personally reviewed the imaging tests through PACS system I reviewed available ER/hospitalization records through the EMR   EKG Interpretation   None       MDM   1. Chest pain    Suspect gastroesophageal reflux disease.  Patient may have some degree of esophagitis.  Patient will likely benefit from GI followup as well as endoscopy versus barium swallow.    Hoy Morn, MD 08/24/13 [phone]

## 2014-02-01 ENCOUNTER — Other Ambulatory Visit: Payer: Self-pay | Admitting: Cardiovascular Disease

## 2014-02-17 ENCOUNTER — Other Ambulatory Visit: Payer: Self-pay | Admitting: Family Medicine

## 2014-02-17 ENCOUNTER — Other Ambulatory Visit (HOSPITAL_COMMUNITY)
Admission: RE | Admit: 2014-02-17 | Discharge: 2014-02-17 | Disposition: A | Discharge status: Home / Self Care | Payer: BC Managed Care – PPO | Source: Ambulatory Visit | Attending: Family Medicine | Admitting: Family Medicine

## 2014-02-17 DIAGNOSIS — Z01419 Encounter for gynecological examination (general) (routine) without abnormal findings: Secondary | ICD-10-CM | POA: Insufficient documentation

## 2014-02-21 LAB — CYTOLOGY - PAP

## 2014-03-06 ENCOUNTER — Other Ambulatory Visit: Payer: Self-pay | Admitting: Cardiovascular Disease

## 2014-04-09 ENCOUNTER — Other Ambulatory Visit: Payer: Self-pay | Admitting: Cardiovascular Disease

## 2014-04-19 ENCOUNTER — Telehealth: Payer: Self-pay | Admitting: *Deleted

## 2014-04-19 ENCOUNTER — Ambulatory Visit (INDEPENDENT_AMBULATORY_CARE_PROVIDER_SITE_OTHER): Payer: BC Managed Care – PPO | Admitting: Nurse Practitioner

## 2014-04-19 ENCOUNTER — Encounter: Payer: Self-pay | Admitting: Nurse Practitioner

## 2014-04-19 VITALS — BP 120/84 | HR 66 | Ht 62.0 in | Wt 227.8 lb

## 2014-04-19 DIAGNOSIS — I2510 Atherosclerotic heart disease of native coronary artery without angina pectoris: Secondary | ICD-10-CM

## 2014-04-19 DIAGNOSIS — E785 Hyperlipidemia, unspecified: Secondary | ICD-10-CM

## 2014-04-19 DIAGNOSIS — I10 Essential (primary) hypertension: Secondary | ICD-10-CM

## 2014-04-19 MED ORDER — ATORVASTATIN CALCIUM 20 MG PO TABS: ORAL_TABLET | ORAL | Status: DC

## 2014-04-19 NOTE — Telephone Encounter (Signed)
S/w medical records @ [phone] Dr. Nancy Fetter office pt just had labs drawn last week requested a faxed copy.

## 2014-04-19 NOTE — Progress Notes (Addendum)
[name] Date of Birth: 1975/09/03 Medical Record #[id number]  History of Present Illness:  [name] is seen back today for a follow up visit. This is a 2 year check. Seen for Dr. Burt Decker. She is a 38 year old female with CAD who presented in 2009 with acute coronary syndrome. She has had stents to the RCA and LCX. Other issues include obesity and HLD. Prior smoker.   Last seen by Dr. Burt Decker in September of 2013. Struggling with weight loss. Saw [name] the PA last year. Was doing ok.   Last stress test in February of 2013 was ok.   Comes back today. Here alone. Needs medicines refilled. Labs are checked by PCP - at [facility]. She feels good. No chest pain. Not short of breath. Not dizzy or lightheaded. Tolerating her medicines. Just had a breakup with her partner. She is walking almost an hour for about 5 days a week. Still eating out but trying to do better. Weight is down a few pounds.   Current Outpatient Prescriptions  Medication Sig Dispense Refill  . aspirin EC 81 MG tablet Take 1 tablet (81 mg total) by mouth daily.      Marland Kitchen atorvastatin (LIPITOR) 20 MG tablet TAKE ONE TABLET BY MOUTH ONCE DAILY *PATIENT  NEEDS  APPOINTMENT  WITH  DR  [name]  FOR  FURTHER  REFILLS*  15 tablet  0  . clopidogrel (PLAVIX) 75 MG tablet Take 1 tablet (75 mg total) by mouth once.  90 tablet  3  . diclofenac (CATAFLAM) 50 MG tablet Take 1 tablet (50 mg total) by mouth 3 (three) times daily.  21 tablet  0  . esomeprazole (NEXIUM) 40 MG capsule Take 1 capsule (40 mg total) by mouth 2 (two) times daily before a meal.  60 capsule  0  . hydrochlorothiazide (HYDRODIURIL) 25 MG tablet Take 1 tablet (25 mg total) by mouth daily.  90 tablet  3  . lisinopril (PRINIVIL,ZESTRIL) 40 MG tablet Take 1 tablet (40 mg total) by mouth daily.  90 tablet  3  . metoprolol (LOPRESSOR) 50 MG tablet Take 1 tablet (50 mg total) by mouth 2 (two) times daily.  180 tablet  3  . Multiple Vitamin (MULTIVITAMIN) capsule Take 1  capsule by mouth daily.         No current facility-administered medications for this visit.    No Known Allergies  Past Medical History  Diagnosis Date  . Hypertension   . Coronary artery disease   . GERD (gastroesophageal reflux disease)   . Obesity   . MI (myocardial infarction)     Nov 2010    Past Surgical History  Procedure Laterality Date  . Cardiac catheterization      Promus drug-eluting stent to the OM 1    History  Smoking status  . Former Smoker  Smokeless tobacco  . Not on file    Comment: 10 packs/year    History  Alcohol Use  . Yes    Comment: rarely     No family history on file.  Review of Systems: The review of systems is per the HPI.  All other systems were reviewed and are negative.  Physical Exam: BP 120/84  Pulse 66  Ht 5' 2"  (1.575 m)  Wt 227 lb 12.8 oz (103.329 kg)  BMI 41.65 kg/m2  SpO2 100% Patient is very pleasant and in no acute distress. Weight is down. She remains obese. Skin is warm and dry. Color is normal.  HEENT is unremarkable. Normocephalic/atraumatic. PERRL. Sclera are nonicteric. Neck is supple. No masses. No JVD. Lungs are clear. Cardiac exam shows a regular rate and rhythm. Abdomen is soft. Extremities are without edema. Gait and ROM are intact. No gross neurologic deficits noted.  Wt Readings from Last 3 Encounters:  04/19/14 227 lb 12.8 oz (103.329 kg)  08/23/13 228 lb (103.42 kg)  04/29/13 227 lb 12.8 oz (103.329 kg)    LABORATORY DATA/PROCEDURES:  Lab Results  Component Value Date   WBC 12.3* 08/16/2010   HGB 12.9 08/16/2010   HCT 38.6 08/16/2010   PLT 359 08/16/2010   GLUCOSE 104* 01/20/2011   CHOL 150 04/26/2012   TRIG 193.0* 04/26/2012   HDL 35.10* 04/26/2012   LDLCALC 76 04/26/2012   ALT 23 04/26/2012   AST 23 04/26/2012   NA 136 01/20/2011   K 4.6 01/20/2011   CL 93* 01/20/2011   CREATININE 0.8 01/20/2011   BUN 13 01/20/2011   CO2 25 01/20/2011   TSH 1.536 Test methodology is 3rd generation TSH 08/10/2008    INR 1.0 03/19/2009   HGBA1C  Value: 4.8 (NOTE)   The ADA recommends the following therapeutic goal for glycemic   control related to Hgb A1C measurement:   Goal of Therapy:   < 7.0% Hgb A1C   Reference: American Diabetes Association: Clinical Practice   Recommendations 2008, Diabetes Care,  2008, 31:(Suppl 1). 08/10/2008    BNP (last 3 results) No results found for this basename: PROBNP,  in the last 8760 hours   Myoview Impression from February 2013  Exercise Capacity: Fair exercise capacity.  BP Response: Normal blood pressure response.  Clinical Symptoms: No chest pain.  ECG Impression: No significant ST segment change suggestive of ischemia.  Comparison with Prior Nuclear Study: No images to compare  Overall Impression: Normal stress nuclear study.  [name] CATH FROM 2009 FINDINGS: Aortic pressure 166/104 with a mean of 131, left ventricular  pressure 166/18.  Left mainstem is widely patent. The left main has no significant  angiographic stenosis. It bifurcates into the LAD and left circumflex.  LAD: The LAD is a large-caliber vessel that courses down and wraps  around the LV apex. There is diffuse plaque throughout the LAD with 30%  long segment stenosis proximally and a 40-50% stenosis in the mid to  distal segment. There are no hemodynamically significant stenoses in  the LAD. The proximal LAD supplies a large first diagonal branch, and  there are smaller second and third diagonals. There are no significant  stenoses in the diagonal branches, but the first diagonal has  nonobstructive 30-40% stenosis in the proximal portion.  Left circumflex: The left circumflex is patent in the proximal portion.  It supplies the first OM which has severe 90% eccentric stenosis in its  midportion. There is diffuse plaque proximal to the stenosis producing  a 50% stenosis. The remaining portions of the OM are patent. Just  beyond the OM branch, the left circumflex is occluded in  the AV groove.  The mid and distal left circumflex fill from a well-formed collateral  off the distal right coronary artery and also the distal AV groove  circumflex fills from the LAD collaterals coming off septal perforating  vessels.  Right coronary artery: The right coronary artery is also severely  diseased. The vessel courses down and has a long segment of severe 80-  90% stenosis in the midportion. There is an acute marginal branch that  arises from  that segment of disease and it has a severe 90% ostial  stenosis. The right coronary artery courses down and supplies a PDA  branch, as well as 2 posterolateral branches, and again the second  posterolateral branch supplies a well-formed collateral to the AV groove  circumflex.  Left ventriculography shows mild inferolateral hypokinesis with overall  preserved LVEF of 60%.   ASSESSMENT:  1. Severe 2-vessel coronary artery disease involving the left  circumflex and right coronary artery.  2. Diffuse nonobstructive left anterior descending stenosis.  3. Widely patent left mainstem.  4. Preserved left ventricular ejection fraction.  5. Successful 2-vessel percutaneous coronary intervention as outlined  above.   PLAN: Recommend a minimum of 12 months of aspirin and Plavix. The  patient has advanced CAD for her young age. She needs very aggressive  risk factor modification.  Juanda Bond. Alicia Knack, MD  Electronically Signed  MDC/MEDQ D: 06/21/2008 T: 06/22/2008 Job: [id number]   Assessment / Plan: 1. CAD with past stents to the RCA and LCX - negative stress Myoview from February of 2013. EKG from January reviewed. Continue with aggressive CV risk factor modification.   2. HLD - labs by PCP - will try to obtain  3. Obesity - discussed at length.  See back in a year. Consider repeat stress testing on return. She is currently walking with no symptoms.   Patient is agreeable to this plan and will call if any problems develop in the interim.    Burtis Junes, RN, [facility] [address] [place], [place]  [address] [phone]   Addendum: Labs from Dr. Nancy Fetter obtained - TSH is normal. TC 148, TG 144, HDL 33 and LDL 86. LFTs normal. A1C of 5.9. Glucose 118. CBC normal. Normal kidney function.

## 2014-04-19 NOTE — Patient Instructions (Signed)
We will ask for your labs from Dr. Nancy Fetter at [facility] on track with diet/exercise  See Dr. Burt Knack  Call the [place] office at [phone] if you have any questions, problems or concerns.

## 2014-06-26 ENCOUNTER — Emergency Department (HOSPITAL_COMMUNITY)
Admission: EM | Admit: 2014-06-26 | Discharge: 2014-06-26 | Disposition: A | Discharge status: Home / Self Care | Payer: BC Managed Care – PPO | Attending: Emergency Medicine | Admitting: Emergency Medicine

## 2014-06-26 ENCOUNTER — Encounter (HOSPITAL_COMMUNITY): Payer: Self-pay | Admitting: Emergency Medicine

## 2014-06-26 DIAGNOSIS — Z7902 Long term (current) use of antithrombotics/antiplatelets: Secondary | ICD-10-CM | POA: Insufficient documentation

## 2014-06-26 DIAGNOSIS — Z87891 Personal history of nicotine dependence: Secondary | ICD-10-CM | POA: Insufficient documentation

## 2014-06-26 DIAGNOSIS — Z7982 Long term (current) use of aspirin: Secondary | ICD-10-CM | POA: Insufficient documentation

## 2014-06-26 DIAGNOSIS — Z9889 Other specified postprocedural states: Secondary | ICD-10-CM | POA: Insufficient documentation

## 2014-06-26 DIAGNOSIS — E669 Obesity, unspecified: Secondary | ICD-10-CM | POA: Insufficient documentation

## 2014-06-26 DIAGNOSIS — I2510 Atherosclerotic heart disease of native coronary artery without angina pectoris: Secondary | ICD-10-CM | POA: Insufficient documentation

## 2014-06-26 DIAGNOSIS — K219 Gastro-esophageal reflux disease without esophagitis: Secondary | ICD-10-CM | POA: Insufficient documentation

## 2014-06-26 DIAGNOSIS — R110 Nausea: Secondary | ICD-10-CM | POA: Insufficient documentation

## 2014-06-26 DIAGNOSIS — Z79899 Other long term (current) drug therapy: Secondary | ICD-10-CM | POA: Insufficient documentation

## 2014-06-26 DIAGNOSIS — I10 Essential (primary) hypertension: Secondary | ICD-10-CM | POA: Insufficient documentation

## 2014-06-26 DIAGNOSIS — I252 Old myocardial infarction: Secondary | ICD-10-CM | POA: Diagnosis not present

## 2014-06-26 LAB — I-STAT CHEM 8, ED
BUN: 13 mg/dL (ref 6–23)
CREATININE: 0.8 mg/dL (ref 0.50–1.10)
Calcium, Ion: 1.12 mmol/L (ref 1.12–1.23)
Chloride: 103 mEq/L (ref 96–112)
Glucose, Bld: 114 mg/dL — ABNORMAL HIGH (ref 70–99)
HEMATOCRIT: 38 % (ref 36.0–46.0)
HEMOGLOBIN: 12.9 g/dL (ref 12.0–15.0)
Potassium: 3.8 mEq/L (ref 3.7–5.3)
Sodium: 137 mEq/L (ref 137–147)
TCO2: 21 mmol/L (ref 0–100)

## 2014-06-26 LAB — TROPONIN I
Troponin I: <0.3 ng/mL (ref <0.30)
Troponin I: <0.3 ng/mL (ref <0.30)

## 2014-06-26 MED ORDER — ONDANSETRON HCL 4 MG/2ML IJ SOLN
4.0000 mg | Freq: Once | INTRAMUSCULAR | Status: AC
  Administered 2014-06-26: 4 mg via INTRAVENOUS
  Filled 2014-06-26: qty 2

## 2014-06-26 NOTE — Discharge Instructions (Signed)

## 2014-06-26 NOTE — ED Notes (Signed)
Pt alert, oriented, and ambulatory upon D/C. She was advised to follow up with her PCP.

## 2014-06-26 NOTE — ED Notes (Signed)
MD Ray at bedside.

## 2014-06-26 NOTE — ED Provider Notes (Signed)
CSN: [id number]     Arrival date & time 06/26/14  0901 History   First MD Initiated Contact with Patient 06/26/14 0919     Chief Complaint  Patient presents with  . Nausea     (Consider location/radiation/quality/duration/timing/severity/associated sxs/prior Treatment) HPI 38 year old female who comes in today stating that she had an onset of severe nausea this morning. She states that she felt like she needed to vomit and have a bowel movement. This improved after 15-20 minute minutes. She was doing her usual activities this morning. 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 She states that she had an MI and a stent 6 years ago. She had some nausea with the MI. Today she does not have any chest pain or discomfort. She states that she has been followed by her cardiologist and has not had any further problems. She has quit smoking. She had not taken her a.m. antihypertensives.  Past Medical History  Diagnosis Date  . Hypertension   . Coronary artery disease   . GERD (gastroesophageal reflux disease)   . Obesity   . MI (myocardial infarction)     Nov 2010   Past Surgical History  Procedure Laterality Date  . Cardiac catheterization      Promus drug-eluting stent to the OM 1   No family history on file. History  Substance Use Topics  . Smoking status: Former Research scientist (life sciences)  . Smokeless tobacco: Not on file     Comment: 10 packs/year  . Alcohol Use: Yes     Comment: rarely    OB History    No data available     Review of Systems  All other systems reviewed and are negative.     Allergies  Review of patient's allergies indicates no known allergies.  Home Medications   Prior to Admission medications   Medication Sig Start Date End Date Taking? Authorizing Provider  aspirin EC 81 MG tablet Take 1 tablet (81 mg total) by mouth daily. 01/06/11  Yes Sherren Mocha, MD  atorvastatin (LIPITOR) 20 MG tablet TAKE ONE TABLET BY MOUTH ONCE DAILY 04/19/14  Yes Burtis Junes, NP   clopidogrel (PLAVIX) 75 MG tablet Take 1 tablet (75 mg total) by mouth once. 07/04/13  Yes Sherren Mocha, MD  esomeprazole (NEXIUM) 40 MG capsule Take 1 capsule (40 mg total) by mouth 2 (two) times daily before a meal. 08/24/13  Yes Hoy Morn, MD  hydrochlorothiazide (HYDRODIURIL) 25 MG tablet Take 1 tablet (25 mg total) by mouth daily. 07/04/13  Yes Sherren Mocha, MD  lisinopril (PRINIVIL,ZESTRIL) 40 MG tablet Take 1 tablet (40 mg total) by mouth daily. 07/04/13  Yes Sherren Mocha, MD  metoprolol (LOPRESSOR) 50 MG tablet Take 1 tablet (50 mg total) by mouth 2 (two) times daily. 07/04/13  Yes Sherren Mocha, MD  Multiple Vitamin (MULTIVITAMIN) capsule Take 1 capsule by mouth daily.     Yes Historical Provider, MD  diclofenac (CATAFLAM) 50 MG tablet Take 1 tablet (50 mg total) by mouth 3 (three) times daily. Patient not taking: Reported on 06/26/2014 07/20/13   Billy Fischer, MD   BP 136/94 mmHg  Pulse 88  Temp(Src) 98 F (36.7 C) (Oral)  Resp 17  Wt 225 lb (102.059 kg)  SpO2 99%  LMP 06/12/2014 Physical Exam  Constitutional: She is oriented to person, place, and time. She appears well-developed and well-nourished.  HENT:  Head: Normocephalic and atraumatic.  Right Ear: External ear normal.  Left Ear: External ear normal.  Nose: Nose normal.  Mouth/Throat: Oropharynx is clear and moist.  Eyes: Conjunctivae and EOM are normal. Pupils are equal, round, and reactive to light.  Neck: Normal range of motion. Neck supple. No JVD present. No tracheal deviation present. No thyromegaly present.  Cardiovascular: Normal rate, regular rhythm, normal heart sounds and intact distal pulses.   Pulmonary/Chest: Effort normal and breath sounds normal. She has no wheezes.  Abdominal: Soft. Bowel sounds are normal. She exhibits no mass. There is no tenderness. There is no guarding.  Musculoskeletal: Normal range of motion.  Lymphadenopathy:    She has no cervical adenopathy.  Neurological: She is  alert and oriented to person, place, and time. She has normal reflexes. No cranial nerve deficit or sensory deficit. Gait normal. GCS eye subscore is 4. GCS verbal subscore is 5. GCS motor subscore is 6.  Reflex Scores:      Bicep reflexes are 2+ on the right side and 2+ on the left side.      Patellar reflexes are 2+ on the right side and 2+ on the left side. Strength is 5/5 bilateral elbow flexor/extensors, wrist extension/flexion, intrinsic hand strength equal Bilateral hip flexion/extension 5/5, knee flexion/extension 5/5, ankle 5/5 flexion extension    Skin: Skin is warm and dry.  Psychiatric: She has a normal mood and affect. Her behavior is normal. Judgment and thought content normal.  Nursing note and vitals reviewed.   ED Course  Procedures (including critical care time) Labs Review Labs Reviewed  I-STAT CHEM 8, ED - Abnormal; Notable for the following:    Glucose, Bld 114 (*)    All other components within normal limits  TROPONIN I  TROPONIN I    Imaging Review No results found.   EKG Interpretation   Date/Time:  Monday June 26 2014 09:21:09 EST Ventricular Rate:  97 PR Interval:  155 QRS Duration: 73 QT Interval:  361 QTC Calculation: 459 R Axis:   0 Text Interpretation:  Sinus rhythm No significant change since last  tracing Confirmed by Adahlia Stembridge MD, Andee Poles ([id number]) on 06/26/2014 9:47:09 AM      MDM   Final diagnoses:  Nausea  H/O acute myocardial infarction   38 year old female history of MI presents today with episode of nausea. This has since resolved. EKG here does not show any significant change from prior and does not appear to be ischemic. She has had troponins 2 done here in the emergency department and these are both normal. The patient has continued to feel well. I have advised the patient regarding return precautions and need for follow-up.   Shaune Pollack, MD 06/26/14 [phone]
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
## 2014-06-26 NOTE — ED Notes (Signed)
Pt reports she is nauseated and feels as if she is going to have a bowel movement but has not vomiting or excreted stool. Did not take any medications to relieve symptoms at home. She reports her aunt recently was sick with a virus.

## 2014-07-03 ENCOUNTER — Other Ambulatory Visit: Payer: Self-pay | Admitting: Cardiovascular Disease

## 2014-10-02 ENCOUNTER — Emergency Department (INDEPENDENT_AMBULATORY_CARE_PROVIDER_SITE_OTHER)
Admission: EM | Admit: 2014-10-02 | Discharge: 2014-10-02 | Disposition: A | Discharge status: Home / Self Care | Payer: PRIVATE HEALTH INSURANCE | Source: Home / Self Care | Attending: Family Medicine | Admitting: Family Medicine

## 2014-10-02 ENCOUNTER — Encounter (HOSPITAL_COMMUNITY): Payer: Self-pay

## 2014-10-02 DIAGNOSIS — Z041 Encounter for examination and observation following transport accident: Secondary | ICD-10-CM

## 2014-10-02 DIAGNOSIS — M549 Dorsalgia, unspecified: Secondary | ICD-10-CM

## 2014-10-02 MED ORDER — IBUPROFEN 800 MG PO TABS: 800.0000 mg | ORAL_TABLET | Freq: Three times a day (TID) | ORAL | Status: DC

## 2014-10-02 MED ORDER — METAXALONE 800 MG PO TABS: 800.0000 mg | ORAL_TABLET | Freq: Three times a day (TID) | ORAL | Status: DC
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
## 2014-11-15 ENCOUNTER — Telehealth (HOSPITAL_COMMUNITY): Payer: Self-pay | Admitting: *Deleted

## 2014-11-15 NOTE — ED Notes (Signed)
Pt. Called for her medical record number for insurance forms.  Pt. verified x 2 and given number. [name] 11/15/2014

## 2015-02-28 ENCOUNTER — Other Ambulatory Visit: Payer: Self-pay

## 2015-02-28 MED ORDER — ATORVASTATIN CALCIUM 20 MG PO TABS: ORAL_TABLET | ORAL | Status: DC

## 2015-02-28 MED ORDER — HYDROCHLOROTHIAZIDE 25 MG PO TABS: 25.0000 mg | ORAL_TABLET | Freq: Every day | ORAL | Status: DC

## 2015-02-28 MED ORDER — CLOPIDOGREL BISULFATE 75 MG PO TABS: 75.0000 mg | ORAL_TABLET | Freq: Every day | ORAL | Status: DC

## 2015-02-28 MED ORDER — METOPROLOL TARTRATE 50 MG PO TABS: 50.0000 mg | ORAL_TABLET | Freq: Two times a day (BID) | ORAL | Status: DC

## 2015-02-28 MED ORDER — LISINOPRIL 40 MG PO TABS: 40.0000 mg | ORAL_TABLET | Freq: Every day | ORAL | Status: DC

## 2015-05-09 ENCOUNTER — Telehealth: Payer: Self-pay | Admitting: *Deleted

## 2015-05-09 ENCOUNTER — Encounter: Payer: Self-pay | Admitting: *Deleted

## 2015-05-09 NOTE — Telephone Encounter (Signed)
called & spoke with pt & got the fm hx & status info.Marland KitchenMarland Kitchen

## 2015-05-14 ENCOUNTER — Ambulatory Visit (INDEPENDENT_AMBULATORY_CARE_PROVIDER_SITE_OTHER): Payer: 59 | Admitting: Cardiovascular Disease

## 2015-05-14 ENCOUNTER — Encounter: Payer: Self-pay | Admitting: Cardiovascular Disease

## 2015-05-14 VITALS — BP 130/100 | HR 70 | Ht 62.0 in | Wt 228.8 lb

## 2015-05-14 DIAGNOSIS — I10 Essential (primary) hypertension: Secondary | ICD-10-CM | POA: Diagnosis not present

## 2015-05-14 NOTE — Progress Notes (Signed)
Cardiology Office Note   Date:  05/14/2015   ID:  Alicia, Decker 26-May-1976, MRN [id number]  PCP:  Lynne Logan, MD  Cardiologist:  Sherren Mocha, MD    Chief Complaint  Patient presents with  . Coronary Artery Disease    History of Present Illness: [name] is a 39 y.o. female who presents for follow-up evaluation. The patient has coronary artery disease and she initially presented with acute coronary syndrome in 2009. She underwent stenting of the right coronary artery and left circumflex vessels. Her left ventricular function has been normal. She underwent a stress test last in 2013 that showed no ischemia. Her left ventricular ejection fraction has been normal.   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 States work is physical and has no exertional symptoms. Labs drawn at the time she was hired 2 months ago. No chest pain, pressure, dyspnea, or edema. Overall feels well. Didn't take BP meds this am before coming in because she needs to take with food.   Past Medical History  Diagnosis Date  . Hypertension   . Coronary artery disease   . GERD (gastroesophageal reflux disease)   . Obesity   . MI (myocardial infarction) [facility])     Nov 2010    Past Surgical History  Procedure Laterality Date  . Cardiac catheterization      Promus drug-eluting stent to the OM 1    Current Outpatient Prescriptions  Medication Sig Dispense Refill  . aspirin EC 81 MG tablet Take 1 tablet (81 mg total) by mouth daily.    Marland Kitchen atorvastatin (LIPITOR) 20 MG tablet TAKE ONE TABLET BY MOUTH ONCE DAILY 90 tablet 2  . clopidogrel (PLAVIX) 75 MG tablet Take 1 tablet (75 mg total) by mouth daily. 30 tablet 2  . esomeprazole (NEXIUM) 40 MG capsule Take 1 capsule (40 mg total) by mouth 2 (two) times daily before a meal. 60 capsule 0  . hydrochlorothiazide (HYDRODIURIL) 25 MG tablet Take 1 tablet (25 mg total) by mouth daily. 30  tablet 3  . lisinopril (PRINIVIL,ZESTRIL) 40 MG tablet Take 1 tablet (40 mg total) by mouth daily. 30 tablet 3  . metoprolol (LOPRESSOR) 50 MG tablet Take 1 tablet (50 mg total) by mouth 2 (two) times daily. 60 tablet 3  . Multiple Vitamin (MULTIVITAMIN) capsule Take 1 capsule by mouth daily.       No current facility-administered medications for this visit.    Allergies:   Review of patient's allergies indicates no known allergies.   Social History:  The patient  reports that she has quit smoking. She does not have any smokeless tobacco history on file. She reports that she drinks alcohol. She reports that she does not use illicit drugs.   Family History:  The patient's family history includes Heart attack in her maternal grandfather; Hypertension in her mother; Stroke in her maternal grandmother.    ROS:  Please see the history of present illness.  Otherwise, review of systems is positive for excessive sweating.  All other systems are reviewed and negative.    PHYSICAL EXAM: VS:  BP 130/100 mmHg  Pulse 70  Ht 5' 2"  (1.575 m)  Wt 228 lb 12.8 oz (103.783 kg)  BMI 41.84 kg/m2 , BMI Body mass index is 41.84 kg/(m^2). GEN: Well nourished, well developed, overweight woman in no acute distress HEENT: normal Neck: no JVD, no masses. No carotid bruits Cardiac: RRR without  murmur or gallop                Respiratory:  clear to auscultation bilaterally, normal work of breathing GI: soft, nontender, nondistended, + BS MS: no deformity or atrophy Ext: no pretibial edema, pedal pulses 2+= bilaterally Skin: warm and dry, no rash Neuro:  Strength and sensation are intact Psych: euthymic mood, full affect  EKG:  EKG is ordered today. The ekg ordered today shows NSR 70 bpm, within normal limits  Recent Labs: 06/26/2014: BUN 13; Creatinine, Ser 0.80; Hemoglobin 12.9; Potassium 3.8; Sodium 137   Lipid Panel     Component Value Date/Time   CHOL 150 04/26/2012 0924   TRIG 193.0* 04/26/2012  0924   HDL 35.10* 04/26/2012 0924   CHOLHDL 4 04/26/2012 0924   VLDL 38.6 04/26/2012 0924   LDLCALC 76 04/26/2012 0924      Wt Readings from Last 3 Encounters:  05/14/15 228 lb 12.8 oz (103.783 kg)  06/26/14 225 lb (102.059 kg)  04/19/14 227 lb 12.8 oz (103.329 kg)    ASSESSMENT AND PLAN: 1.  CAD, native vessel, without sx's of angina: no med changes. Needs to work on diet and lifestyle. We reviewed this at length and she was given specific instructions on reducing carbohydrate/starch intake.  2. HTN, essential: didn't take oral antihypertensives this am. Reports BP in range when she checks on occasion at [facility]. Lengthy discussion regarding dietary modification, exercise, and weight loss. No med changes recommended today.  3. Hyperlipidemia: continue atorvastatin. Work on lifestyle modification. Requested she fax recent labs for review.  Current medicines are reviewed with the patient today.  The patient does not have concerns regarding medicines.  Labs/ tests ordered today include:   Orders Placed This Encounter  Procedures  . EKG 12-Lead   Disposition:   FU one year  Signed, Sherren Mocha, MD  05/14/2015 4:56 PM    [facility] [address], [place], [place]  [address] Phone: [phone]; Fax: [phone]

## 2015-05-14 NOTE — Patient Instructions (Signed)
Medication Instructions:  Your physician recommends that you continue on your current medications as directed. Please refer to the Current Medication list given to you today.  Labwork: Please fax a copy of recent lab results to [phone], Attn: Dr Burt Knack  Testing/Procedures: No new orders.   Follow-Up: Your physician wants you to follow-up in: 1 YEAR with Dr Burt Knack.  You will receive a reminder letter in the mail two months in advance. If you don't receive a letter, please call our office to schedule the follow-up appointment.   Any Other Special Instructions Will Be Listed Below (If Applicable).  Please follow a low carbohydrate diet.

## 2015-07-06 ENCOUNTER — Other Ambulatory Visit: Payer: Self-pay | Admitting: Cardiovascular Disease

## 2016-01-24 ENCOUNTER — Ambulatory Visit
Admission: RE | Admit: 2016-01-24 | Discharge: 2016-01-24 | Disposition: A | Discharge status: Home / Self Care | Payer: Worker's Compensation | Source: Ambulatory Visit | Attending: Occupational Medicine | Admitting: Occupational Medicine

## 2016-01-24 ENCOUNTER — Other Ambulatory Visit: Payer: Self-pay | Admitting: Occupational Medicine

## 2016-01-24 DIAGNOSIS — R0789 Other chest pain: Secondary | ICD-10-CM

## 2016-02-26 ENCOUNTER — Encounter (HOSPITAL_COMMUNITY): Payer: Self-pay | Admitting: Emergency Medicine

## 2016-02-26 ENCOUNTER — Emergency Department (HOSPITAL_COMMUNITY)
Admission: EM | Admit: 2016-02-26 | Discharge: 2016-02-26 | Disposition: A | Discharge status: Home / Self Care | Payer: 59 | Attending: Emergency Medicine | Admitting: Emergency Medicine

## 2016-02-26 DIAGNOSIS — I252 Old myocardial infarction: Secondary | ICD-10-CM | POA: Insufficient documentation

## 2016-02-26 DIAGNOSIS — Z79899 Other long term (current) drug therapy: Secondary | ICD-10-CM | POA: Diagnosis not present

## 2016-02-26 DIAGNOSIS — E782 Mixed hyperlipidemia: Secondary | ICD-10-CM | POA: Insufficient documentation

## 2016-02-26 DIAGNOSIS — J029 Acute pharyngitis, unspecified: Secondary | ICD-10-CM | POA: Diagnosis present

## 2016-02-26 DIAGNOSIS — Z7982 Long term (current) use of aspirin: Secondary | ICD-10-CM | POA: Diagnosis not present

## 2016-02-26 DIAGNOSIS — I10 Essential (primary) hypertension: Secondary | ICD-10-CM | POA: Diagnosis not present

## 2016-02-26 DIAGNOSIS — J40 Bronchitis, not specified as acute or chronic: Secondary | ICD-10-CM | POA: Diagnosis not present

## 2016-02-26 DIAGNOSIS — Z87891 Personal history of nicotine dependence: Secondary | ICD-10-CM | POA: Diagnosis not present

## 2016-02-26 DIAGNOSIS — I2510 Atherosclerotic heart disease of native coronary artery without angina pectoris: Secondary | ICD-10-CM | POA: Diagnosis not present

## 2016-02-26 MED ORDER — AZITHROMYCIN 250 MG PO TABS: 250.0000 mg | ORAL_TABLET | Freq: Every day | ORAL | 0 refills | Status: DC

## 2016-02-26 MED ORDER — BENZONATATE 100 MG PO CAPS: 100.0000 mg | ORAL_CAPSULE | Freq: Three times a day (TID) | ORAL | 0 refills | Status: DC

## 2016-02-26 NOTE — ED Notes (Signed)
Bed: WA26 Expected date:  Expected time:  Means of arrival:  Comments: 

## 2016-02-26 NOTE — ED Provider Notes (Signed)
[facility] DEPT Provider Note   CSN: [id number] Arrival date & time: 02/26/16  1030  First Provider Contact:  None    By signing my name below, I, Eustaquio Maize, attest that this documentation has been prepared under the direction and in the presence of Alyse Low, PA-C.  Electronically Signed: Eustaquio Maize, ED Scribe. 02/26/16. 12:17 PM.   History   Chief Complaint Chief Complaint  Patient presents with  . Cough  . Sore Throat    HPI Laurella A Arseneau is a 40 y.o. female who presents to the Emergency Department complaining of gradual onset, constant, productive cough x 5 days. Pt also complains of a sore throat, chills, and a mild headache. Pt is able to tolerate own secretions. She has been taking Theraflu and Nyquil with mild relief. No recent sick contact with similar symptoms. Pt does not come into contact with children. Denies fever or any other associated symptoms. She is a non smoker.    The history is provided by the patient. No language interpreter was used.    Past Medical History:  Diagnosis Date  . Coronary artery disease   . GERD (gastroesophageal reflux disease)   . Hypertension   . MI (myocardial infarction) [facility])    Nov 2010  . Obesity     Patient Active Problem List   Diagnosis Date Noted  . TOBACCO ABUSE 04/06/2009  . HYPERLIPIDEMIA-MIXED 11/09/2008  . OBESITY, UNSPECIFIED 11/08/2008  . HYPERTENSION, UNSPECIFIED 11/08/2008  . CAD, UNSPECIFIED SITE 11/08/2008  . GERD 11/08/2008  . CHEST PAIN-UNSPECIFIED 11/08/2008    Past Surgical History:  Procedure Laterality Date  . CARDIAC CATHETERIZATION     Promus drug-eluting stent to the OM 1    OB History    No data available       Home Medications    Prior to Admission medications   Medication Sig Start Date End Date Taking? Authorizing Provider  aspirin EC 81 MG tablet Take 1 tablet (81 mg total) by mouth daily. 01/06/11   Sherren Mocha, MD  atorvastatin (LIPITOR) 20 MG tablet TAKE  ONE TABLET BY MOUTH ONCE DAILY 02/28/15   Sherren Mocha, MD  clopidogrel (PLAVIX) 75 MG tablet Take 1 tablet (75 mg total) by mouth daily. 02/28/15   Sherren Mocha, MD  clopidogrel (PLAVIX) 75 MG tablet TAKE ONE TABLET BY MOUTH ONCE DAILY 07/06/15   Sherren Mocha, MD  esomeprazole (NEXIUM) 40 MG capsule Take 1 capsule (40 mg total) by mouth 2 (two) times daily before a meal. 08/24/13   Jola Schmidt, MD  hydrochlorothiazide (HYDRODIURIL) 25 MG tablet TAKE ONE TABLET BY MOUTH ONCE DAILY 07/06/15   Sherren Mocha, MD  lisinopril (PRINIVIL,ZESTRIL) 40 MG tablet TAKE ONE TABLET BY MOUTH ONCE DAILY 07/06/15   Sherren Mocha, MD  metoprolol (LOPRESSOR) 50 MG tablet TAKE ONE TABLET BY MOUTH TWICE DAILY 07/06/15   Sherren Mocha, MD  Multiple Vitamin (MULTIVITAMIN) capsule Take 1 capsule by mouth daily.      Historical Provider, MD    Family History Family History  Problem Relation Age of Onset  . Hypertension Mother   . Stroke Maternal Grandmother   . Heart attack Maternal Grandfather     Social History Social History  Substance Use Topics  . Smoking status: Former Research scientist (life sciences)  . Smokeless tobacco: Never Used     Comment: 10 packs/year  . Alcohol use Yes     Comment: rarely      Allergies   Review of patient's allergies indicates no known allergies.  Review of Systems Review of Systems  Constitutional: Positive for chills. Negative for fever.  HENT: Positive for sore throat.   Respiratory: Positive for cough.   Neurological: Positive for headaches.  All other systems reviewed and are negative.    Physical Exam Updated Vital Signs BP 135/97 (BP Location: Right Arm)   Pulse 95   Temp 98.6 F (37 C) (Oral)   Resp 16   SpO2 99%   Physical Exam  Constitutional: She is oriented to person, place, and time. She appears well-developed and well-nourished. No distress.  HENT:  Head: Normocephalic and atraumatic.  Mouth/Throat: Posterior oropharyngeal erythema present.  Erythema of the  throat  Eyes: Conjunctivae and EOM are normal.  Neck: Neck supple. No tracheal deviation present.  Cardiovascular: Normal rate.   Pulmonary/Chest: Effort normal. She has rhonchi.  Cough and rhonchi present  Abdominal: Soft. There is no tenderness. There is no rebound, no guarding and no CVA tenderness.  Musculoskeletal: Normal range of motion.  Neurological: She is alert and oriented to person, place, and time.  Skin: Skin is warm and dry.  Psychiatric: She has a normal mood and affect. Her behavior is normal.  Nursing note and vitals reviewed.    ED Treatments / Results  DIAGNOSTIC STUDIES: Oxygen Saturation is 99% on RA, normal by my interpretation.    COORDINATION OF CARE: 12:15 PM-Discussed treatment plan with pt at bedside and pt agreed to plan.    Labs (all labs ordered are listed, but only abnormal results are displayed) Labs Reviewed - No data to display  EKG  EKG Interpretation None       Radiology No results found.  Procedures Procedures (including critical care time)  Medications Ordered in ED Medications - No data to display   Initial Impression / Assessment and Plan / ED Course  I have reviewed the triage vital signs and the nursing notes.  Pertinent labs & imaging results that were available during my care of the patient were reviewed by me and considered in my medical decision making (see chart for details).  Clinical Course     I personally performed the services in this documentation, which was scribed in my presence.  The recorded information has been reviewed and considered.   Ronnald Collum.   Final Clinical Impressions(s) / ED Diagnoses   Final diagnoses:  Sore throat  Bronchitis    New Prescriptions New Prescriptions   No medications on file   Meds ordered this encounter  Medications  . azithromycin (ZITHROMAX) 250 MG tablet    Sig: Take 1 tablet (250 mg total) by mouth daily. Take first 2 tablets together, then 1 every day  until finished.    Dispense:  6 tablet    Refill:  0    Order Specific Question:   Supervising Provider    Answer:   Lajean Saver [[id number]]  . benzonatate (TESSALON) 100 MG capsule    Sig: Take 1 capsule (100 mg total) by mouth every 8 (eight) hours.    Dispense:  21 capsule    Refill:  0    Order Specific Question:   Supervising Provider    Answer:   Lajean Saver [place], PA-C 02/26/16 [place], MD 02/26/16 1726

## 2016-02-26 NOTE — ED Triage Notes (Signed)
Pt complains of coughing and chest congestion at night x 3 day. Using theraflu and has had some relief with that. No fever. No chills.  Complains of sore throat and nasal drainage.

## 2016-03-07 ENCOUNTER — Other Ambulatory Visit: Payer: Self-pay | Admitting: Cardiovascular Disease

## 2016-05-12 NOTE — Progress Notes (Signed)
Cardiology Office Note    Date:  05/14/2016   ID:  [name], [name] [birth year], MRN [id number]  PCP:  [facility]  Cardiologist:  Dr. Burt Knack Electrophysiologist: n/a  Chief Complaint: 12 months follow up  History of Present Illness:   [name] is a 40 y.o. female CAD s/p DES to RCA and LCx, HLD, former smoker (quit since MI) and HTN who presented for follow up.   Presented with acute MI 06/2008. She underwent stenting of the right coronary artery and left circumflex vessels.  She underwent a stress test (last) in 2013 that showed no ischemia. Her left ventricular ejection fraction has been normal.   She was doing well on cardiac stand point when last seen by Dr. Burt Knack 05/2015.   Here today for follow up. 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 Hasn't had any blood work for the past few years. The patient denies nausea, vomiting, fever, chest pain, palpitations, shortness of breath, orthopnea, PND, dizziness, syncope, cough, congestion, abdominal pain, hematochezia, melena, lower extremity edema.   Past Medical History:  Diagnosis Date  . Coronary artery disease   . GERD (gastroesophageal reflux disease)   . Hyperlipidemia LDL goal <70   . Hypertension   . MI (myocardial infarction)    Nov 2010  . Obesity    Past Surgical History:  Procedure Laterality Date  . CARDIAC CATHETERIZATION     Promus drug-eluting stent to the OM 1    Current Medications:  Prior to Admission medications   Medication Sig Start Date End Date Taking? Authorizing Provider  aspirin EC 81 MG tablet Take 1 tablet (81 mg total) by mouth daily. 01/06/11  Yes Sherren Mocha, MD  atorvastatin (LIPITOR) 20 MG tablet TAKE ONE TABLET BY MOUTH ONCE DAILY 03/07/16  Yes Sherren Mocha, MD  benzonatate (TESSALON) 100 MG capsule Take 1 capsule (100 mg  total) by mouth every 8 (eight) hours. 02/26/16  Yes Hollace Kinnier Sofia, PA-C  clopidogrel (PLAVIX) 75 MG tablet TAKE ONE TABLET BY MOUTH ONCE DAILY 07/06/15  Yes Sherren Mocha, MD  esomeprazole (NEXIUM) 40 MG capsule Take 1 capsule (40 mg total) by mouth 2 (two) times daily before a meal. 08/24/13  Yes Jola Schmidt, MD  hydrochlorothiazide (HYDRODIURIL) 25 MG tablet TAKE ONE TABLET BY MOUTH ONCE DAILY 07/06/15  Yes Sherren Mocha, MD  lisinopril (PRINIVIL,ZESTRIL) 40 MG tablet TAKE ONE TABLET BY MOUTH ONCE DAILY 07/06/15  Yes Sherren Mocha, MD  metoprolol (LOPRESSOR) 50 MG tablet TAKE ONE TABLET BY MOUTH TWICE DAILY 07/06/15  Yes Sherren Mocha, MD  Multiple Vitamin (MULTIVITAMIN) capsule Take 1 capsule by mouth daily.     Yes Historical Provider, MD     Allergies:   Review of patient's allergies indicates no known allergies.   Social History   Social History  . Marital status: Single    Spouse name: N/A  . Number of children: N/A  . Years of education: N/A   Social History Main Topics  . Smoking status: Former Research scientist (life sciences)  . Smokeless tobacco: Never Used     Comment: 10 packs/year  . Alcohol use Yes     Comment: rarely   . Drug use: No  . Sexual activity: No   Other Topics Concern  . 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 A  10-pack-year history of smoking, still smoking. Occasional alcohol use. No drug use.      Family History:  The patient's family history includes Heart attack in her maternal grandfather; Hypertension in her mother; Stroke in her maternal grandmother.   ROS:   Please see the history of present illness.    ROS All other systems reviewed and are negative.   PHYSICAL EXAM:   VS:  BP 100/80 (BP Location: Left Arm, Patient Position: Sitting, Cuff Size: Large)   Pulse 74   Ht 5' 2"  (1.575 m)   Wt 227 lb (103 kg)   BMI 41.52 kg/m    GEN: Well nourished, well developed, in no acute distress  HEENT: normal  Neck: no JVD,  carotid bruits, or masses Cardiac: RRR; no murmurs, rubs, or gallops,no edema  Respiratory:  clear to auscultation bilaterally, normal work of breathing GI: soft, nontender, nondistended, + BS MS: no deformity or atrophy  Skin: warm and dry, no rash Neuro:  Alert and Oriented x 3, Strength and sensation are intact Psych: euthymic mood, full affect  Wt Readings from Last 3 Encounters:  05/14/16 227 lb (103 kg)  05/14/15 228 lb 12.8 oz (103.8 kg)  06/26/14 225 lb (102.1 kg)      Studies/Labs Reviewed:   EKG:  EKG is ordered today.  The ekg ordered today demonstrates NSR at rate of 74 bpm  Recent Labs: No results found for requested labs within last 8760 hours.   Lipid Panel    Component Value Date/Time   CHOL 150 04/26/2012 0924   TRIG 193.0 (H) 04/26/2012 0924   HDL 35.10 (L) 04/26/2012 0924   CHOLHDL 4 04/26/2012 0924   VLDL 38.6 04/26/2012 0924   LDLCALC 76 04/26/2012 0924    Additional studies/ records that were reviewed today include:    Cardiac Catheterization: 06/2008 FINDINGS:  Aortic pressure 166/104 with a mean of 131, left ventricular  pressure 166/18.   Left mainstem is widely patent.  The left main has no significant  angiographic stenosis.  It bifurcates into the LAD and left circumflex.   LAD:  The LAD is a large-caliber vessel that courses down and wraps  around the LV apex.  There is diffuse plaque throughout the LAD with 30%  long segment stenosis proximally and a 40-50% stenosis in the mid to  distal segment.  There are no hemodynamically significant stenoses in  the LAD.  The proximal LAD supplies a large first diagonal branch, and  there are smaller second and third diagonals.  There are no significant  stenoses in the diagonal branches, but the first diagonal has  nonobstructive 30-40% stenosis in the proximal portion.   Left circumflex:  The left circumflex is patent in the proximal portion.  It supplies the first OM which has severe 90%  eccentric stenosis in its  midportion.  There is diffuse plaque proximal to the stenosis producing  a 50% stenosis.  The remaining portions of the OM are patent.  Just  beyond the OM branch, the left circumflex is occluded in the AV groove.  The mid and distal left circumflex fill from a well-formed collateral  off the distal right coronary artery and also the distal AV groove  circumflex fills from the LAD collaterals coming off septal perforating  vessels.   Right coronary artery:  The right coronary artery is also severely  diseased.  The vessel courses down and has a long segment of severe 80-  90% stenosis in the midportion.  There is an acute marginal branch that  arises from that segment of disease and it has a severe 90% ostial  stenosis.  The right coronary artery courses down and supplies a PDA  branch, as well as 2 posterolateral branches, and again the second  posterolateral branch supplies a well-formed collateral to the AV groove  circumflex.   Left ventriculography shows mild inferolateral hypokinesis with overall  preserved LVEF of 60%.   ASSESSMENT:  1. Severe 2-vessel coronary artery disease involving the left      circumflex and right coronary artery.  2. Diffuse nonobstructive left anterior descending stenosis.  3. Widely patent left mainstem.  4. Preserved left ventricular ejection fraction.  5. Successful 2-vessel percutaneous coronary intervention as outlined      above.   PLAN:  Recommend a minimum of 12 months of aspirin and Plavix.  The  patient has advanced CAD for her young age.  She needs very aggressive  risk factor modification.   Stress test 08/2011: Normal    ASSESSMENT & PLAN:    1. CAD s/p DES to RCA and LCx in 2009 - No anginal pain or dyspnea. Continue DAPT therapy with ASA and plavix. Dr. Burt Knack to decide length of DAPT (stent placed in 2009). Continue statin, BB and ACE)  2. HTN - Stable. Continue current regimen.   3. HLD -  Will update lab.  4. Obesity Body mass index is 41.52 kg/m.Marland Kitchen Advised lifestyle modification regarding weight loss, exercise and health healthy diet.    Medication Adjustments/Labs and Tests Ordered: Current medicines are reviewed at length with the patient today.  Concerns regarding medicines are outlined above.  Medication changes, Labs and Tests ordered today are listed in the Patient Instructions below. Patient Instructions  Your physician recommends that you continue on your current medications as directed. Please refer to the Current Medication list given to you today.  Your physician recommends that you return for lab work in:  Wednesday  FASTING  LIPID AND LIVER  AND CBC Your physician recommends that you schedule a follow-up appointment in:  [place], [place], [place]  05/14/2016 3:46 PM    [facility] [place], [place], [place]  [address] Phone: [phone]; Fax: [phone]

## 2016-05-14 ENCOUNTER — Ambulatory Visit (INDEPENDENT_AMBULATORY_CARE_PROVIDER_SITE_OTHER): Payer: 59 | Admitting: Physician Assistant

## 2016-05-14 ENCOUNTER — Encounter: Payer: Self-pay | Admitting: Physician Assistant

## 2016-05-14 ENCOUNTER — Ambulatory Visit: Payer: Self-pay | Admitting: Physician Assistant

## 2016-05-14 ENCOUNTER — Encounter (INDEPENDENT_AMBULATORY_CARE_PROVIDER_SITE_OTHER): Payer: Self-pay

## 2016-05-14 VITALS — BP 100/80 | HR 74 | Ht 62.0 in | Wt 227.0 lb

## 2016-05-14 DIAGNOSIS — E785 Hyperlipidemia, unspecified: Secondary | ICD-10-CM | POA: Diagnosis not present

## 2016-05-14 DIAGNOSIS — Z79899 Other long term (current) drug therapy: Secondary | ICD-10-CM | POA: Diagnosis not present

## 2016-05-14 DIAGNOSIS — I10 Essential (primary) hypertension: Secondary | ICD-10-CM

## 2016-05-14 DIAGNOSIS — I2510 Atherosclerotic heart disease of native coronary artery without angina pectoris: Secondary | ICD-10-CM

## 2016-05-14 NOTE — Patient Instructions (Addendum)
Your physician recommends that you continue on your current medications as directed. Please refer to the Current Medication list given to you today.  Your physician recommends that you return for lab work in:  Wednesday  FASTING  LIPID AND LIVER  AND CBC Your physician recommends that you schedule a follow-up appointment in:  [place]

## 2016-05-21 ENCOUNTER — Other Ambulatory Visit: Payer: Self-pay

## 2016-06-05 ENCOUNTER — Other Ambulatory Visit: Payer: Self-pay | Admitting: Cardiovascular Disease

## 2016-08-14 ENCOUNTER — Encounter (HOSPITAL_COMMUNITY): Payer: Self-pay | Admitting: Emergency Medicine

## 2016-08-14 ENCOUNTER — Emergency Department (HOSPITAL_COMMUNITY)
Admission: EM | Admit: 2016-08-14 | Discharge: 2016-08-15 | Disposition: A | Discharge status: Home / Self Care | Payer: 59 | Attending: Emergency Medicine | Admitting: Emergency Medicine

## 2016-08-14 DIAGNOSIS — I10 Essential (primary) hypertension: Secondary | ICD-10-CM | POA: Diagnosis not present

## 2016-08-14 DIAGNOSIS — I2510 Atherosclerotic heart disease of native coronary artery without angina pectoris: Secondary | ICD-10-CM | POA: Diagnosis not present

## 2016-08-14 DIAGNOSIS — I252 Old myocardial infarction: Secondary | ICD-10-CM | POA: Diagnosis not present

## 2016-08-14 DIAGNOSIS — Z7982 Long term (current) use of aspirin: Secondary | ICD-10-CM | POA: Diagnosis not present

## 2016-08-14 DIAGNOSIS — R519 Headache, unspecified: Secondary | ICD-10-CM

## 2016-08-14 DIAGNOSIS — Z87891 Personal history of nicotine dependence: Secondary | ICD-10-CM | POA: Insufficient documentation

## 2016-08-14 DIAGNOSIS — R51 Headache: Secondary | ICD-10-CM | POA: Diagnosis present

## 2016-08-14 MED ORDER — DIPHENHYDRAMINE HCL 50 MG/ML IJ SOLN
25.0000 mg | Freq: Once | INTRAMUSCULAR | Status: AC
  Administered 2016-08-15: 25 mg via INTRAVENOUS
  Filled 2016-08-14: qty 1

## 2016-08-14 MED ORDER — IBUPROFEN 400 MG PO TABS
400.0000 mg | ORAL_TABLET | Freq: Once | ORAL | Status: AC
  Administered 2016-08-14: 400 mg via ORAL

## 2016-08-14 MED ORDER — ACETAMINOPHEN 325 MG PO TABS
650.0000 mg | ORAL_TABLET | Freq: Once | ORAL | Status: AC
  Administered 2016-08-15: 650 mg via ORAL
  Filled 2016-08-14: qty 2

## 2016-08-14 MED ORDER — SODIUM CHLORIDE 0.9 % IV BOLUS (SEPSIS)
1000.0000 mL | Freq: Once | INTRAVENOUS | Status: AC
  Administered 2016-08-15: 1000 mL via INTRAVENOUS

## 2016-08-14 MED ORDER — IBUPROFEN 400 MG PO TABS
ORAL_TABLET | ORAL | Status: AC
Start: 2016-08-14 — End: 2016-08-14
  Administered 2016-08-14: 400 mg via ORAL
  Filled 2016-08-14: qty 1

## 2016-08-14 MED ORDER — METOPROLOL TARTRATE 25 MG PO TABS
50.0000 mg | ORAL_TABLET | Freq: Once | ORAL | Status: AC
  Administered 2016-08-15: 50 mg via ORAL
  Filled 2016-08-14: qty 2

## 2016-08-14 MED ORDER — METOCLOPRAMIDE HCL 5 MG/ML IJ SOLN
10.0000 mg | Freq: Once | INTRAMUSCULAR | Status: AC
  Administered 2016-08-15: 10 mg via INTRAVENOUS
  Filled 2016-08-14: qty 2

## 2016-08-14 NOTE — ED Provider Notes (Signed)
[facility] DEPT Provider Note   CSN: [id number] Arrival date & time: 08/14/16  1903     History   Chief Complaint Chief Complaint  Patient presents with  . Headache    HPI Omnia A Egler is a 41 y.o. female.  Alanee A Catalfamo is a 41 y.o. Female who presents to the emergency room complaining of headache for the past 3 days. Patient reports her headache has been waxing and waning over the past 3 days. She reports sometimes it's on the left side of her head and other times on the right. Right now she complains of 8 out of 10 generalized headache pain. She reports she's had some intermittent nausea but no vomiting or diarrhea. She denies any abdominal pain. She reports she forgot to take all of her medications today. She reports she's had some intermittent feelings of her heart eating faster. She missed her lopressor today. She denies thunderclap headache. She has taken Tylenol and ibuprofen with little relief. Patient denies fevers, neck pain, neck stiffness, double vision, changes to her vision, ear pain, chest pain, shortness of breath, numbness, tingling, weakness, abdominal pain, diarrhea, head injury or falls.   The history is provided by the patient. No language interpreter was used.  Headache   Pertinent negatives include no fever, no palpitations, no shortness of breath, no nausea and no vomiting.    Past Medical History:  Diagnosis Date  . Coronary artery disease   . GERD (gastroesophageal reflux disease)   . Hyperlipidemia LDL goal <70   . Hypertension   . MI (myocardial infarction)    Nov 2010  . Obesity     Patient Active Problem List   Diagnosis Date Noted  . Coronary artery disease   . Hypertension   . Hyperlipidemia LDL goal <70   . TOBACCO ABUSE 04/06/2009  . HYPERLIPIDEMIA-MIXED 11/09/2008  . OBESITY, UNSPECIFIED 11/08/2008  . HYPERTENSION, UNSPECIFIED 11/08/2008  . CAD, UNSPECIFIED SITE 11/08/2008  . GERD 11/08/2008  . CHEST  PAIN-UNSPECIFIED 11/08/2008    Past Surgical History:  Procedure Laterality Date  . CARDIAC CATHETERIZATION     Promus drug-eluting stent to the OM 1    OB History    No data available       Home Medications    Prior to Admission medications   Medication Sig Start Date End Date Taking? Authorizing Provider  aspirin EC 81 MG tablet Take 1 tablet (81 mg total) by mouth daily. 01/06/11   Sherren Mocha, MD  atorvastatin (LIPITOR) 20 MG tablet TAKE ONE TABLET BY MOUTH ONCE DAILY 06/06/16   Sherren Mocha, MD  benzonatate (TESSALON) 100 MG capsule Take 1 capsule (100 mg total) by mouth every 8 (eight) hours. 02/26/16   Fransico Meadow, PA-C  clopidogrel (PLAVIX) 75 MG tablet TAKE ONE TABLET BY MOUTH ONCE DAILY 06/06/16   Sherren Mocha, MD  esomeprazole (NEXIUM) 40 MG capsule Take 1 capsule (40 mg total) by mouth 2 (two) times daily before a meal. 08/24/13   Jola Schmidt, MD  hydrochlorothiazide (HYDRODIURIL) 25 MG tablet TAKE ONE TABLET BY MOUTH ONCE DAILY 06/06/16   Sherren Mocha, MD  lisinopril (PRINIVIL,ZESTRIL) 40 MG tablet TAKE ONE TABLET BY MOUTH ONCE DAILY 06/06/16   Sherren Mocha, MD  metoprolol (LOPRESSOR) 50 MG tablet TAKE ONE TABLET BY MOUTH TWICE DAILY 06/06/16   Sherren Mocha, MD  Multiple Vitamin (MULTIVITAMIN) capsule Take 1 capsule by mouth daily.      Historical Provider, MD    Family History Family History  Problem Relation Age of Onset  . Hypertension Mother   . Stroke Maternal Grandmother   . Heart attack Maternal Grandfather     Social History Social History  Substance Use Topics  . Smoking status: Former Research scientist (life sciences)  . Smokeless tobacco: Never Used     Comment: 10 packs/year  . Alcohol use Yes     Comment: rarely      Allergies   Patient has no known allergies.   Review of Systems Review of Systems  Constitutional: Negative for chills and fever.  HENT: Negative for congestion and ear pain.   Eyes: Positive for photophobia (resolved. ). Negative for  visual disturbance.  Respiratory: Negative for cough, shortness of breath and wheezing.   Cardiovascular: Negative for chest pain and palpitations.  Gastrointestinal: Negative for abdominal pain, diarrhea, nausea and vomiting.  Genitourinary: Negative for dysuria.  Musculoskeletal: Negative for back pain, neck pain and neck stiffness.  Skin: Negative for rash.  Neurological: Positive for headaches. Negative for dizziness, syncope, weakness, light-headedness and numbness.     Physical Exam Updated Vital Signs BP 137/94   Pulse 88   Temp 98.3 F (36.8 C)   Resp 18   Ht 5' 2"  (1.575 m)   Wt 101.2 kg   LMP 08/11/2016   SpO2 100%   BMI 40.79 kg/m   Physical Exam  Constitutional: She is oriented to person, place, and time. She appears well-developed and well-nourished. No distress.  Nontoxic appearing.  HENT:  Head: Normocephalic and atraumatic.  Right Ear: External ear normal.  Left Ear: External ear normal.  Mouth/Throat: Oropharynx is clear and moist.  Bilateral tympanic membranes are pearly-gray without erythema or loss of landmarks.  No temporal edema or tenderness.  Eyes: Conjunctivae and EOM are normal. Pupils are equal, round, and reactive to light. Right eye exhibits no discharge. Left eye exhibits no discharge.  Neck: Normal range of motion. Neck supple. No JVD present. No tracheal deviation present.  Cardiovascular: Normal rate, regular rhythm, normal heart sounds and intact distal pulses.  Exam reveals no gallop and no friction rub.   No murmur heard. Heart rate is 88.  Pulmonary/Chest: Effort normal and breath sounds normal. No stridor. No respiratory distress. She has no wheezes. She has no rales.  Abdominal: Soft. There is no tenderness. There is no guarding.  Musculoskeletal: She exhibits no edema.  Lymphadenopathy:    She has no cervical adenopathy.  Neurological: She is alert and oriented to person, place, and time. No cranial nerve deficit or sensory deficit.  She exhibits normal muscle tone. Coordination normal.  Patient is alert and oriented 3. Cranial nerves are intact. Speech is clear and coherent. No pronator drift. Finger to nose intact bilaterally. EOMs are intact. Vision is grossly intact. Normal gait. Sensation is intact. Bilateral upper and lower extremities.  Skin: Skin is warm and dry. Capillary refill takes less than 2 seconds. No rash noted. She is not diaphoretic. No erythema. No pallor.  Psychiatric: She has a normal mood and affect. Her behavior is normal.  Nursing note and vitals reviewed.    ED Treatments / Results  Labs (all labs ordered are listed, but only abnormal results are displayed) Labs Reviewed - No data to display  EKG  EKG Interpretation None       Radiology No results found.  Procedures Procedures (including critical care time)  Medications Ordered in ED Medications  ibuprofen (ADVIL,MOTRIN) tablet 400 mg (400 mg Oral Given 08/14/16 1943)  sodium chloride 0.9 % bolus  1,000 mL (1,000 mLs Intravenous New Bag/Given 08/15/16 0048)  metoCLOPramide (REGLAN) injection 10 mg (10 mg Intravenous Given 08/15/16 0048)  diphenhydrAMINE (BENADRYL) injection 25 mg (25 mg Intravenous Given 08/15/16 0049)  acetaminophen (TYLENOL) tablet 650 mg (650 mg Oral Given 08/15/16 0008)  metoprolol tartrate (LOPRESSOR) tablet 50 mg (50 mg Oral Given 08/15/16 0007)     Initial Impression / Assessment and Plan / ED Course  I have reviewed the triage vital signs and the nursing notes.  Pertinent labs & imaging results that were available during my care of the patient were reviewed by me and considered in my medical decision making (see chart for details).  Clinical Course    This is a 41 y.o. Female who presents to the emergency room complaining of headache for the past 3 days. Patient reports her headache has been waxing and waning over the past 3 days. She reports sometimes it's on the left side of her head and other times on the  right. Right now she complains of 8 out of 10 generalized headache pain. She reports she's had some intermittent nausea but no vomiting or diarrhea. She denies any abdominal pain. Pt HA treated and resolved while in ED.  Presentation is not concerning for [facility], ICH, Meningitis, or temporal arteritis. Pt is afebrile with no focal neuro deficits, nuchal rigidity, or change in vision. Pt is to follow up with PCP to discuss prophylactic medication. I advised the patient to follow-up with their primary care provider this week. I advised the patient to return to the emergency department with new or worsening symptoms or new concerns. The patient verbalized understanding and agreement with plan.      Final Clinical Impressions(s) / ED Diagnoses   Final diagnoses:  Bad headache    New Prescriptions New Prescriptions   No medications on file     Waynetta Pean, PA-C 08/15/16 [place], MD 08/15/16 0830

## 2016-08-14 NOTE — ED Triage Notes (Signed)
Pt presents to ED for assessment of headache x3 days with nausea, light-headedness and light/sound sensitivity.  Pt sts hx of headaches, but this one is "behind my face" and "it won't go away".

## 2016-09-10 DIAGNOSIS — I252 Old myocardial infarction: Secondary | ICD-10-CM | POA: Insufficient documentation

## 2016-09-10 DIAGNOSIS — I2510 Atherosclerotic heart disease of native coronary artery without angina pectoris: Secondary | ICD-10-CM | POA: Insufficient documentation

## 2016-09-10 DIAGNOSIS — E785 Hyperlipidemia, unspecified: Secondary | ICD-10-CM | POA: Insufficient documentation

## 2016-09-10 DIAGNOSIS — I10 Essential (primary) hypertension: Secondary | ICD-10-CM | POA: Insufficient documentation

## 2016-11-06 ENCOUNTER — Encounter: Payer: Self-pay | Admitting: Cardiovascular Disease

## 2016-11-24 ENCOUNTER — Encounter: Payer: Self-pay | Admitting: Cardiovascular Disease

## 2016-11-24 ENCOUNTER — Ambulatory Visit (INDEPENDENT_AMBULATORY_CARE_PROVIDER_SITE_OTHER): Payer: 59 | Admitting: Cardiovascular Disease

## 2016-11-24 VITALS — BP 130/90 | HR 76 | Ht 62.0 in | Wt 225.1 lb

## 2016-11-24 DIAGNOSIS — I10 Essential (primary) hypertension: Secondary | ICD-10-CM | POA: Diagnosis not present

## 2016-11-24 DIAGNOSIS — E785 Hyperlipidemia, unspecified: Secondary | ICD-10-CM | POA: Diagnosis not present

## 2016-11-24 DIAGNOSIS — I2510 Atherosclerotic heart disease of native coronary artery without angina pectoris: Secondary | ICD-10-CM

## 2016-11-24 NOTE — Patient Instructions (Signed)
Medication Instructions:  Your physician has recommended you make the following change in your medication:  1. STOP Plavix (clopidogrel)  Labwork: No new orders.   Testing/Procedures: No new orders.   Follow-Up: Your physician wants you to follow-up in: 1 YEAR with Dr Burt Knack.  You will receive a reminder letter in the mail two months in advance. If you don't receive a letter, please call our office to schedule the follow-up appointment.   Any Other Special Instructions Will Be Listed Below (If Applicable).     If you need a refill on your cardiac medications before your next appointment, please call your pharmacy.

## 2016-11-24 NOTE — Progress Notes (Signed)
Cardiology Office Note Date:  11/24/2016   ID:  Linley, Moskal 1975/10/13, MRN [id number]  PCP:  [facility]  Cardiologist:  Sherren Mocha, MD    Chief Complaint  Patient presents with  . Routine Office Visit     History of Present Illness: [name] is a 41 y.o. female who presents for Follow-up of coronary artery disease. The patient initially presented in 2009 with acute coronary syndrome and underwent stenting of the RCA and left circumflex. LV function is been normal. Last stress test in 2013 showed no ischemia.  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 She's having a really difficult time with this. She complains of generalized fatigue, soreness in all of her muscles, and exhaustion. She has not had chest pain or chest pressure. She denies shortness of breath or edema, lightheadedness, heart palpitations, or syncope. She's compliant with her medications. She hasn't taken her blood pressure medicines this morning because she was takes them with food.   Past Medical History:  Diagnosis Date  . Coronary artery disease   . GERD (gastroesophageal reflux disease)   . Hyperlipidemia LDL goal <70   . Hypertension   . MI (myocardial infarction) [facility])    Nov 2010  . Obesity     Past Surgical History:  Procedure Laterality Date  . CARDIAC CATHETERIZATION     Promus drug-eluting stent to the OM 1    Current Outpatient Prescriptions  Medication Sig Dispense Refill  . aspirin EC 81 MG tablet Take 1 tablet (81 mg total) by mouth daily.    Marland Kitchen atorvastatin (LIPITOR) 20 MG tablet TAKE ONE TABLET BY MOUTH ONCE DAILY 90 tablet 3  . benzonatate (TESSALON) 100 MG capsule Take 1 capsule (100 mg total) by mouth every 8 (eight) hours. 21 capsule 0  . esomeprazole (NEXIUM) 40 MG capsule Take 1 capsule (40 mg total) by mouth 2 (two) times daily before a meal.  60 capsule 0  . hydrochlorothiazide (HYDRODIURIL) 25 MG tablet TAKE ONE TABLET BY MOUTH ONCE DAILY 90 tablet 3  . lisinopril (PRINIVIL,ZESTRIL) 40 MG tablet TAKE ONE TABLET BY MOUTH ONCE DAILY 90 tablet 3  . metoprolol (LOPRESSOR) 50 MG tablet TAKE ONE TABLET BY MOUTH TWICE DAILY 180 tablet 3  . Multiple Vitamin (MULTIVITAMIN) capsule Take 1 capsule by mouth daily.      . traMADol (ULTRAM) 50 MG tablet Take 50 mg by mouth every 6 (six) hours as needed for pain.     No current facility-administered medications for this visit.     Allergies:   Patient has no known allergies.   Social History:  The patient  reports that she has quit smoking. She has never used smokeless tobacco. She reports that she drinks alcohol. She reports that she does not use drugs.   Family History:  The patient's family history includes Heart attack in her maternal grandfather; Hypertension in her mother; Stroke in her maternal grandmother.    ROS:  Please see the history of present illness.  Otherwise, review of systems is positive for headache.  All other systems are reviewed and negative.    PHYSICAL EXAM: VS:  BP 130/90   Pulse 76   Ht 5' 2"  (1.575 m)   Wt 225 lb 1.9 oz (102.1 kg)   BMI 41.17 kg/m  , BMI Body mass index is 41.17 kg/m. GEN: Well nourished, well  developed, pleasant obese woman in no acute distress  HEENT: normal  Neck: no JVD, no masses. No carotid bruits Cardiac: RRR without murmur or gallop                Respiratory:  clear to auscultation bilaterally, normal work of breathing GI: soft, nontender, nondistended, + BS MS: no deformity or atrophy  Ext: no pretibial edema, pedal pulses 2+= bilaterally Skin: warm and dry, no rash Neuro:  Strength and sensation are intact Psych: euthymic mood, full affect  EKG:  EKG is not ordered today.  Recent Labs: No results found for requested labs within last 8760 hours.   Lipid Panel     Component Value Date/Time   CHOL 150 04/26/2012 0924     TRIG 193.0 (H) 04/26/2012 0924   HDL 35.10 (L) 04/26/2012 0924   CHOLHDL 4 04/26/2012 0924   VLDL 38.6 04/26/2012 0924   LDLCALC 76 04/26/2012 0924      Wt Readings from Last 3 Encounters:  11/24/16 225 lb 1.9 oz (102.1 kg)  08/14/16 223 lb (101.2 kg)  05/14/16 227 lb (103 kg)     ASSESSMENT AND PLAN: 1.  CAD, native vessel, without symptoms of angina: The patient appears clinically stable. She is several years out from PCI and it would be appropriate to discontinue Plavix at this point. She will remain on aspirin 81 mg, a statin drug, and the remainder of her medical program without other changes.  2. HTN: Blood pressure is reasonably controlled. She states that her blood pressure always goes up in the doctor's office and in addition she hasn't taken her morning medications.  3. Hyperlipidemia: continues on atorvastatin. Lipids reviewed through care everywhere. LDL cholesterol is at goal. We discussed the importance of lifestyle modification. We talked extensively about an exercise program. She is considering a job change because at present she has such fatigue and exhaustion with her physical work that she is unable to do any aerobic exercise. We discussed dietary modification in light of her lab review which demonstrated hyperglycemia.  Current medicines are reviewed with the patient today.  The patient does not have concerns regarding medicines.  Labs/ tests ordered today include:  No orders of the defined types were placed in this encounter.   Disposition:   FU one year  Signed, Sherren Mocha, MD  11/24/2016 5:44 PM    [address], [place], [place]  [address] Phone: [phone]; Fax: [phone]

## 2017-01-07 ENCOUNTER — Telehealth: Payer: Self-pay | Admitting: Cardiovascular Disease

## 2017-01-07 NOTE — Telephone Encounter (Signed)
Walk In Pt Form-patient has questions concerning meds. Placed in [employer].

## 2017-01-13 ENCOUNTER — Telehealth: Payer: Self-pay

## 2017-01-13 MED ORDER — LISINOPRIL-HYDROCHLOROTHIAZIDE 20-12.5 MG PO TABS: 2.0000 | ORAL_TABLET | Freq: Every day | ORAL | 11 refills | Status: DC

## 2017-01-13 NOTE — Telephone Encounter (Signed)
Pt contacted the office to request a change in her medication.  The pt is currently taking Lisinopril 260m daily and HCTZ 251mdaily. The pt would like to switch this to the combination pill due to cost of medication. Per Dr CoBurt Knackkay to switch to Lisinopril HCT 20/12.60m39make two tablets my mouth daily.  New Rx sent to the pharmacy. Pt verbalized understanding of medication change and instructions.

## 2017-04-11 ENCOUNTER — Emergency Department (HOSPITAL_COMMUNITY)
Admission: EM | Admit: 2017-04-11 | Discharge: 2017-04-11 | Disposition: A | Discharge status: Home / Self Care | Payer: BC Managed Care – PPO | Attending: Emergency Medicine | Admitting: Emergency Medicine

## 2017-04-11 ENCOUNTER — Encounter (HOSPITAL_COMMUNITY): Payer: Self-pay

## 2017-04-11 ENCOUNTER — Emergency Department (HOSPITAL_COMMUNITY): Payer: BC Managed Care – PPO

## 2017-04-11 DIAGNOSIS — Y999 Unspecified external cause status: Secondary | ICD-10-CM | POA: Insufficient documentation

## 2017-04-11 DIAGNOSIS — I10 Essential (primary) hypertension: Secondary | ICD-10-CM | POA: Insufficient documentation

## 2017-04-11 DIAGNOSIS — S39012A Strain of muscle, fascia and tendon of lower back, initial encounter: Secondary | ICD-10-CM

## 2017-04-11 DIAGNOSIS — I252 Old myocardial infarction: Secondary | ICD-10-CM | POA: Diagnosis not present

## 2017-04-11 DIAGNOSIS — Y929 Unspecified place or not applicable: Secondary | ICD-10-CM | POA: Diagnosis not present

## 2017-04-11 DIAGNOSIS — S3992XA Unspecified injury of lower back, initial encounter: Secondary | ICD-10-CM | POA: Diagnosis present

## 2017-04-11 DIAGNOSIS — Y939 Activity, unspecified: Secondary | ICD-10-CM | POA: Insufficient documentation

## 2017-04-11 DIAGNOSIS — Z87891 Personal history of nicotine dependence: Secondary | ICD-10-CM | POA: Insufficient documentation

## 2017-04-11 DIAGNOSIS — I259 Chronic ischemic heart disease, unspecified: Secondary | ICD-10-CM | POA: Diagnosis not present

## 2017-04-11 LAB — URINALYSIS, ROUTINE W REFLEX MICROSCOPIC
BILIRUBIN URINE: NEGATIVE
GLUCOSE, UA: NEGATIVE mg/dL
HGB URINE DIPSTICK: NEGATIVE
KETONES UR: NEGATIVE mg/dL
NITRITE: NEGATIVE
PROTEIN: NEGATIVE mg/dL
Specific Gravity, Urine: 1.018 (ref 1.005–1.030)
pH: 6 (ref 5.0–8.0)

## 2017-04-11 LAB — PREGNANCY, URINE: Preg Test, Ur: NEGATIVE

## 2017-04-11 MED ORDER — CYCLOBENZAPRINE HCL 10 MG PO TABS: 5.0000 mg | ORAL_TABLET | Freq: Once | ORAL | Status: DC

## 2017-04-11 MED ORDER — IBUPROFEN 800 MG PO TABS
800.0000 mg | ORAL_TABLET | Freq: Once | ORAL | Status: AC
  Administered 2017-04-11: 800 mg via ORAL
  Filled 2017-04-11: qty 1

## 2017-04-11 MED ORDER — CYCLOBENZAPRINE HCL 5 MG PO TABS: 5.0000 mg | ORAL_TABLET | Freq: Three times a day (TID) | ORAL | 0 refills | Status: DC | PRN
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
## 2017-04-11 NOTE — Discharge Instructions (Signed)
Take motrin for pain.   Take flexeril for muscle spasms. Expect to be stiff and sore for several days.   Rest for 2-3 days   See your doctor  Return to ER if you have worse neck pain, back pain, headaches, vomiting.

## 2017-04-23 ENCOUNTER — Encounter (HOSPITAL_COMMUNITY): Payer: Self-pay | Admitting: Emergency Medicine

## 2017-04-23 ENCOUNTER — Emergency Department (HOSPITAL_COMMUNITY): Payer: BC Managed Care – PPO

## 2017-04-23 DIAGNOSIS — K2970 Gastritis, unspecified, without bleeding: Secondary | ICD-10-CM | POA: Diagnosis not present

## 2017-04-23 DIAGNOSIS — E876 Hypokalemia: Secondary | ICD-10-CM | POA: Insufficient documentation

## 2017-04-23 DIAGNOSIS — I2510 Atherosclerotic heart disease of native coronary artery without angina pectoris: Secondary | ICD-10-CM | POA: Diagnosis not present

## 2017-04-23 DIAGNOSIS — R079 Chest pain, unspecified: Secondary | ICD-10-CM | POA: Diagnosis present

## 2017-04-23 DIAGNOSIS — R1013 Epigastric pain: Secondary | ICD-10-CM | POA: Diagnosis not present

## 2017-04-23 DIAGNOSIS — E785 Hyperlipidemia, unspecified: Secondary | ICD-10-CM | POA: Insufficient documentation

## 2017-04-23 DIAGNOSIS — Z7982 Long term (current) use of aspirin: Secondary | ICD-10-CM | POA: Diagnosis not present

## 2017-04-23 DIAGNOSIS — Z5321 Procedure and treatment not carried out due to patient leaving prior to being seen by health care provider: Secondary | ICD-10-CM | POA: Insufficient documentation

## 2017-04-23 DIAGNOSIS — R110 Nausea: Secondary | ICD-10-CM | POA: Diagnosis not present

## 2017-04-23 DIAGNOSIS — Z87891 Personal history of nicotine dependence: Secondary | ICD-10-CM | POA: Insufficient documentation

## 2017-04-23 DIAGNOSIS — R072 Precordial pain: Secondary | ICD-10-CM | POA: Diagnosis not present

## 2017-04-23 DIAGNOSIS — E1165 Type 2 diabetes mellitus with hyperglycemia: Secondary | ICD-10-CM | POA: Diagnosis not present

## 2017-04-23 DIAGNOSIS — Z79899 Other long term (current) drug therapy: Secondary | ICD-10-CM | POA: Insufficient documentation

## 2017-04-23 DIAGNOSIS — I10 Essential (primary) hypertension: Secondary | ICD-10-CM | POA: Diagnosis not present

## 2017-04-23 DIAGNOSIS — I252 Old myocardial infarction: Secondary | ICD-10-CM | POA: Diagnosis not present

## 2017-04-23 LAB — BASIC METABOLIC PANEL
ANION GAP: 16 — AB (ref 5–15)
BUN: 12 mg/dL (ref 6–20)
CHLORIDE: 97 mmol/L — AB (ref 101–111)
CO2: 19 mmol/L — AB (ref 22–32)
Calcium: 9 mg/dL (ref 8.9–10.3)
Creatinine, Ser: 0.89 mg/dL (ref 0.44–1.00)
GFR calc Af Amer: >60 mL/min (ref >60)
GLUCOSE: 324 mg/dL — AB (ref 65–99)
POTASSIUM: 2.9 mmol/L — AB (ref 3.5–5.1)
Sodium: 132 mmol/L — ABNORMAL LOW (ref 135–145)

## 2017-04-23 LAB — CBC
HCT: 38.5 % (ref 36.0–46.0)
HEMOGLOBIN: 13 g/dL (ref 12.0–15.0)
MCH: 31 pg (ref 26.0–34.0)
MCHC: 33.8 g/dL (ref 30.0–36.0)
MCV: 91.9 fL (ref 78.0–100.0)
PLATELETS: 452 10*3/uL — AB (ref 150–400)
RBC: 4.19 MIL/uL (ref 3.87–5.11)
RDW: 12.3 % (ref 11.5–15.5)
WBC: 11.7 10*3/uL — AB (ref 4.0–10.5)

## 2017-04-23 LAB — I-STAT TROPONIN, ED: TROPONIN I, POC: 0 ng/mL (ref 0.00–0.08)
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
## 2017-04-24 ENCOUNTER — Emergency Department (HOSPITAL_COMMUNITY)
Admission: EM | Admit: 2017-04-24 | Discharge: 2017-04-24 | Disposition: A | Discharge status: Home / Self Care | Payer: BC Managed Care – PPO | Attending: Emergency Medicine | Admitting: Emergency Medicine

## 2017-04-24 ENCOUNTER — Encounter (HOSPITAL_COMMUNITY): Payer: Self-pay | Admitting: Neurology

## 2017-04-24 ENCOUNTER — Emergency Department (HOSPITAL_COMMUNITY)
Admission: EM | Admit: 2017-04-24 | Discharge: 2017-04-24 | Disposition: A | Discharge status: Home / Self Care | Payer: BC Managed Care – PPO | Source: Home / Self Care

## 2017-04-24 ENCOUNTER — Other Ambulatory Visit: Payer: Self-pay

## 2017-04-24 DIAGNOSIS — E119 Type 2 diabetes mellitus without complications: Secondary | ICD-10-CM

## 2017-04-24 DIAGNOSIS — E1165 Type 2 diabetes mellitus with hyperglycemia: Secondary | ICD-10-CM

## 2017-04-24 DIAGNOSIS — K2970 Gastritis, unspecified, without bleeding: Secondary | ICD-10-CM

## 2017-04-24 DIAGNOSIS — R1013 Epigastric pain: Secondary | ICD-10-CM

## 2017-04-24 DIAGNOSIS — R072 Precordial pain: Secondary | ICD-10-CM

## 2017-04-24 DIAGNOSIS — E876 Hypokalemia: Secondary | ICD-10-CM

## 2017-04-24 DIAGNOSIS — R110 Nausea: Secondary | ICD-10-CM

## 2017-04-24 LAB — HEPATIC FUNCTION PANEL
ALT: 25 U/L (ref 14–54)
AST: 25 U/L (ref 15–41)
Albumin: 3.7 g/dL (ref 3.5–5.0)
Alkaline Phosphatase: 78 U/L (ref 38–126)
Bilirubin, Direct: <0.1 mg/dL — ABNORMAL LOW (ref 0.1–0.5)
Total Bilirubin: 0.3 mg/dL (ref 0.3–1.2)
Total Protein: 6.7 g/dL (ref 6.5–8.1)

## 2017-04-24 LAB — I-STAT BETA HCG BLOOD, ED (MC, WL, AP ONLY): I-stat hCG, quantitative: <5 m[IU]/mL

## 2017-04-24 LAB — I-STAT TROPONIN, ED: Troponin i, poc: 0 ng/mL (ref 0.00–0.08)

## 2017-04-24 LAB — CBC
HCT: 36.6 % (ref 36.0–46.0)
Hemoglobin: 12.7 g/dL (ref 12.0–15.0)
MCH: 31.5 pg (ref 26.0–34.0)
MCHC: 34.7 g/dL (ref 30.0–36.0)
MCV: 90.8 fL (ref 78.0–100.0)
Platelets: 416 K/uL — ABNORMAL HIGH (ref 150–400)
RBC: 4.03 MIL/uL (ref 3.87–5.11)
RDW: 12 % (ref 11.5–15.5)
WBC: 10.2 K/uL (ref 4.0–10.5)

## 2017-04-24 LAB — BASIC METABOLIC PANEL WITH GFR
Anion gap: 10 (ref 5–15)
BUN: 11 mg/dL (ref 6–20)
CO2: 21 mmol/L — ABNORMAL LOW (ref 22–32)
Calcium: 8.7 mg/dL — ABNORMAL LOW (ref 8.9–10.3)
Chloride: 101 mmol/L (ref 101–111)
Creatinine, Ser: 0.81 mg/dL (ref 0.44–1.00)
GFR calc Af Amer: >60 mL/min
GFR calc non Af Amer: >60 mL/min
Glucose, Bld: 263 mg/dL — ABNORMAL HIGH (ref 65–99)
Potassium: 3.2 mmol/L — ABNORMAL LOW (ref 3.5–5.1)
Sodium: 132 mmol/L — ABNORMAL LOW (ref 135–145)

## 2017-04-24 LAB — URINALYSIS, ROUTINE W REFLEX MICROSCOPIC
Bilirubin Urine: NEGATIVE
Glucose, UA: NEGATIVE mg/dL
Hgb urine dipstick: NEGATIVE
Ketones, ur: NEGATIVE mg/dL
Leukocytes, UA: NEGATIVE
Nitrite: NEGATIVE
Protein, ur: NEGATIVE mg/dL
Specific Gravity, Urine: 1.013 (ref 1.005–1.030)
pH: 6 (ref 5.0–8.0)

## 2017-04-24 LAB — LIPASE, BLOOD: Lipase: 28 U/L (ref 11–51)

## 2017-04-24 MED ORDER — FAMOTIDINE IN NACL 20-0.9 MG/50ML-% IV SOLN
20.0000 mg | Freq: Once | INTRAVENOUS | Status: AC
  Administered 2017-04-24: 20 mg via INTRAVENOUS
  Filled 2017-04-24: qty 50

## 2017-04-24 MED ORDER — RANITIDINE HCL 150 MG PO TABS: 150.0000 mg | ORAL_TABLET | Freq: Two times a day (BID) | ORAL | 0 refills | Status: DC

## 2017-04-24 MED ORDER — ONDANSETRON 4 MG PO TBDP: 4.0000 mg | ORAL_TABLET | Freq: Three times a day (TID) | ORAL | 0 refills | Status: DC | PRN

## 2017-04-24 MED ORDER — SODIUM CHLORIDE 0.9 % IV BOLUS (SEPSIS)
1000.0000 mL | Freq: Once | INTRAVENOUS | Status: AC
  Administered 2017-04-24: 1000 mL via INTRAVENOUS

## 2017-04-24 MED ORDER — ONDANSETRON HCL 4 MG/2ML IJ SOLN
4.0000 mg | Freq: Once | INTRAMUSCULAR | Status: AC
  Administered 2017-04-24: 4 mg via INTRAVENOUS
  Filled 2017-04-24: qty 2

## 2017-04-24 MED ORDER — GI COCKTAIL ~~LOC~~
30.0000 mL | Freq: Once | ORAL | Status: AC
  Administered 2017-04-24: 30 mL via ORAL
  Filled 2017-04-24: qty 30

## 2017-04-24 MED ORDER — METFORMIN HCL 500 MG PO TABS: 500.0000 mg | ORAL_TABLET | Freq: Two times a day (BID) | ORAL | 0 refills | Status: DC

## 2017-04-24 MED ORDER — POTASSIUM CHLORIDE CRYS ER 20 MEQ PO TBCR
40.0000 meq | EXTENDED_RELEASE_TABLET | Freq: Once | ORAL | Status: AC
  Administered 2017-04-24: 40 meq via ORAL
  Filled 2017-04-24: qty 2

## 2017-04-24 NOTE — ED Notes (Signed)
Patient called for radiology several times, no answer.

## 2017-04-24 NOTE — ED Triage Notes (Addendum)
Pt reports epigastric/chest pain since Wednesday. No radiation. Was here last night, didn't stay, now pain is better. Denies sob, n/v/d. Does have hx of MI but she reports this is not the same. Does not want any new blood work done since she was just here last night. Is a x 4. She does report hx of stents. After discussing, pt agreeable to blood work, but not xray.

## 2017-04-24 NOTE — Discharge Instructions (Signed)
Your symptoms are likely either due to musculoskeletal pain, or could be from gastritis or an ulcer. You will need to take zantac as directed, and avoid spicy/fatty/acidic foods, avoid soda/coffee/tea/alcohol. Avoid laying down flat within 30 minutes of eating. Avoid NSAIDs like ibuprofen/aleve/motrin/etc on an empty stomach. May consider using over the counter tums/maalox as needed for additional relief. Use zofran as directed as needed for nausea. Use tylenol as needed for pain. You may also use ice or heat to the area of pain.  Your labs today showed that you have diabetes. Start taking metformin to treat this issue; when you first start taking it, you may notice some gastrointestinal discomfort such as diarrhea or cramping; this will stop once you adjust to taking it. In order to lessen this side effect, start taking only ONE pill daily for the first week, then increase to one pill TWICE daily. Follow up with your regular doctor for ongoing management of this issue, and for any further medication adjustments.  Your potassium was slightly low, which was corrected here today, but see the list of foods below to find ways to supplement your potassium intake.  Follow up with your regular doctor in 5-7 days for recheck of symptoms. Return to the ER for changes or worsening symptoms.

## 2017-04-24 NOTE — ED Provider Notes (Signed)
[facility] DEPT Provider Note   CSN: [id number] Arrival date & time: 04/24/17  1335     History   Chief Complaint Chief Complaint  Patient presents with  . Chest Pain    HPI [name] is a 41 y.o. female with a PMHx of CAD/MI s/p DESx1, HTN, HLD, GERD, obesity, and tobacco use, who presents to the ED with complaints of gradual onset lower sternal/epigastric CP that began Wednesday night 2 days ago while she was sitting at rest. She states this feels different than her prior MI. She describes the pain is 6/10 constant aching nonradiating lower central/epigastric chest pain that worsens with palpation to the area, unchanged with exertion or inspiration, and with no treatments tried prior to arrival. She reports associated nausea. Admits to frequent NSAID use, and also admits to EtOH use approximately 3x/wk. No longer a tobacco smoker, quit after her MI. Chart review reveals that she first presented to the ED for these complaints last night around 11pm, had labs/imaging done but decided to leave without being seen due to the long wait time. Labs yesterday showed hyperglycemia 324 with mildly low bicarb at 19, hypokalemia at 2.9, and anion gap 16; CBC with chronic thrombocytosis and mildly elevated WBC at 11.7. Troponin and CXR yesterday was negative.   Of note, she denies any prior history of diabetes, per chart review her last blood work at her PCPs office was in March 2018 where her blood sugar was 151, no hemoglobin A1c was done at that time. She had never been told that she has diabetes before this, and has never had blood sugars over 151 until now. PCP is Nicholes Rough PA-C at [facility]; cardiologist is Dr. Burt Knack.   She denies diaphoresis, lightheadedness, fevers, chills, cough, SOB, LE swelling, recent travel/surgery/immobilization, estrogen use, personal/family hx of DVT/PE, abd pain, V/D/C, hematuria, dysuria, myalgias, arthralgias, claudication, orthopnea, numbness, tingling,  focal weakness, or any other complaints at this time.    The history is provided by the patient and medical records. No language interpreter was used.  Chest Pain   This is a new problem. The current episode started 2 days ago. The problem occurs constantly. The problem has not changed since onset.The pain is associated with rest. The pain is present in the substernal region. The pain is at a severity of 6/10. The pain is mild. Quality: aching. The pain does not radiate. Duration of episode(s) is 2 days. Exacerbated by: palpation. Associated symptoms include nausea. Pertinent negatives include no abdominal pain, no claudication, no cough, no diaphoresis, no fever, no lower extremity edema, no numbness, no orthopnea, no shortness of breath, no vomiting and no weakness. She has tried nothing for the symptoms. The treatment provided no relief. Risk factors include obesity.  Her past medical history is significant for CAD, diabetes (newly diagnosed today), hyperlipidemia, hypertension and MI.  Pertinent negatives for past medical history include no DVT and no PE.  Procedure history is positive for cardiac catheterization.    Past Medical History:  Diagnosis Date  . Coronary artery disease   . GERD (gastroesophageal reflux disease)   . Hyperlipidemia LDL goal <70   . Hypertension   . MI (myocardial infarction) [facility])    Nov 2010  . Obesity     Patient Active Problem List   Diagnosis Date Noted  . Coronary artery disease   . Hypertension   . Hyperlipidemia LDL goal <70   . TOBACCO ABUSE 04/06/2009  . HYPERLIPIDEMIA-MIXED 11/09/2008  . OBESITY, UNSPECIFIED  11/08/2008  . HYPERTENSION, UNSPECIFIED 11/08/2008  . CAD, UNSPECIFIED SITE 11/08/2008  . GERD 11/08/2008  . CHEST PAIN-UNSPECIFIED 11/08/2008    Past Surgical History:  Procedure Laterality Date  . CARDIAC CATHETERIZATION     Promus drug-eluting stent to the OM 1    OB History    No data available       Home Medications     Prior to Admission medications   Medication Sig Start Date End Date Taking? Authorizing Provider  aspirin EC 81 MG tablet Take 1 tablet (81 mg total) by mouth daily. 01/06/11   Sherren Mocha, MD  atorvastatin (LIPITOR) 20 MG tablet TAKE ONE TABLET BY MOUTH ONCE DAILY 06/06/16   Sherren Mocha, MD  benzonatate (TESSALON) 100 MG capsule Take 1 capsule (100 mg total) by mouth every 8 (eight) hours. 02/26/16   Fransico Meadow, PA-C  cyclobenzaprine (FLEXERIL) 5 MG tablet Take 1 tablet (5 mg total) by mouth 3 (three) times daily as needed for muscle spasms. 04/11/17   Drenda Freeze, MD  esomeprazole (NEXIUM) 40 MG capsule Take 1 capsule (40 mg total) by mouth 2 (two) times daily before a meal. 08/24/13   Jola Schmidt, MD  lisinopril-hydrochlorothiazide (PRINZIDE,ZESTORETIC) 20-12.5 MG tablet Take 2 tablets by mouth daily. 01/13/17   Sherren Mocha, MD  metoprolol (LOPRESSOR) 50 MG tablet TAKE ONE TABLET BY MOUTH TWICE DAILY 06/06/16   Sherren Mocha, MD  Multiple Vitamin (MULTIVITAMIN) capsule Take 1 capsule by mouth daily.      [provider]  traMADol (ULTRAM) 50 MG tablet Take 50 mg by mouth every 6 (six) hours as needed for pain.    [provider]    Family History Family History  Problem Relation Age of Onset  . Hypertension Mother   . Stroke Maternal Grandmother   . Heart attack Maternal Grandfather     Social History Social History  Substance Use Topics  . Smoking status: Former Research scientist (life sciences)  . Smokeless tobacco: Never Used     Comment: 10 packs/year  . Alcohol use Yes     Comment: rarely      Allergies   Patient has no known allergies.   Review of Systems Review of Systems  Constitutional: Negative for chills, diaphoresis and fever.  Respiratory: Negative for cough and shortness of breath.   Cardiovascular: Positive for chest pain. Negative for orthopnea, claudication and leg swelling.  Gastrointestinal: Positive for nausea. Negative for abdominal pain,  constipation, diarrhea and vomiting.  Genitourinary: Negative for dysuria and hematuria.  Musculoskeletal: Negative for arthralgias and myalgias.  Skin: Negative for color change.  Allergic/Immunologic: Positive for immunocompromised state (DM2 diagnosed today).  Neurological: Negative for weakness, light-headedness and numbness.  Psychiatric/Behavioral: Negative for confusion.   All other systems reviewed and are negative for acute change except as noted in the HPI.    Physical Exam Updated Vital Signs BP 122/80 (BP Location: Right Arm)   Pulse 71   Temp 98.2 F (36.8 C) (Oral)   Resp 16   Ht 5' 2"  (1.575 m)   Wt 99.8 kg (220 lb)   LMP 04/23/2017 (Exact Date)   SpO2 99%   BMI 40.24 kg/m   Physical Exam  Constitutional: She is oriented to person, place, and time. Vital signs are normal. She appears well-developed and well-nourished.  Non-toxic appearance. No distress.  Afebrile, nontoxic, NAD  HENT:  Head: Normocephalic and atraumatic.  Mouth/Throat: Oropharynx is clear and moist and mucous membranes are normal.  Eyes: Conjunctivae and EOM  are normal. Right eye exhibits no discharge. Left eye exhibits no discharge.  Neck: Normal range of motion. Neck supple.  Cardiovascular: Normal rate, regular rhythm, normal heart sounds and intact distal pulses.  Exam reveals no gallop and no friction rub.   No murmur heard. RRR, nl s1/s2, no m/r/g, distal pulses intact, no pedal edema   Pulmonary/Chest: Effort normal and breath sounds normal. No respiratory distress. She has no decreased breath sounds. She has no wheezes. She has no rhonchi. She has no rales. She exhibits tenderness. She exhibits no crepitus, no deformity and no retraction.    CTAB in all lung fields, no w/r/r, no hypoxia or increased WOB, speaking in full sentences, SpO2 99% on RA Chest wall with mild TTP to lower sternal area just above the xyphoid, without crepitus, deformities, or retractions   Abdominal: Soft. Normal  appearance and bowel sounds are normal. She exhibits no distension. There is no tenderness. There is no rigidity, no rebound, no guarding, no CVA tenderness, no tenderness at McBurney's point and negative Murphy's sign.  Soft, NTND, +BS throughout, no r/g/r, neg murphy's, neg mcburney's, no CVA TTP   Musculoskeletal: Normal range of motion.  MAE x4 Strength and sensation grossly intact in all extremities Distal pulses intact No pedal edema, neg homan's bilaterally  Neurological: She is alert and oriented to person, place, and time. She has normal strength. No sensory deficit.  Skin: Skin is warm, dry and intact. No rash noted.  Psychiatric: She has a normal mood and affect.  Nursing note and vitals reviewed.    ED Treatments / Results  Labs (all labs ordered are listed, but only abnormal results are displayed) Labs Reviewed  BASIC METABOLIC PANEL - Abnormal; Notable for the following:       Result Value   Sodium 132 (*)    Potassium 3.2 (*)    CO2 21 (*)    Glucose, Bld 263 (*)    Calcium 8.7 (*)    All other components within normal limits  CBC - Abnormal; Notable for the following:    Platelets 416 (*)    All other components within normal limits  HEPATIC FUNCTION PANEL - Abnormal; Notable for the following:    Bilirubin, Direct <0.1 (*)    All other components within normal limits  LIPASE, BLOOD  URINALYSIS, ROUTINE W REFLEX MICROSCOPIC  I-STAT TROPONIN, ED  I-STAT BETA HCG BLOOD, ED ([facility], [facility], [facility] ONLY)    EKG  EKG Interpretation  Date/Time:  Friday April 24 2017 13:57:14 EDT Ventricular Rate:  67 PR Interval:  162 QRS Duration: 80 QT Interval:  406 QTC Calculation: 429 R Axis:   -2 Text Interpretation:  Normal sinus rhythm Low voltage QRS Abnormal ECG No significant change since last tracing Confirmed by Duffy Bruce [id number]) on 04/24/2017 5:32:58 PM       Radiology Dg Chest 2 View  Result Date: 04/23/2017 CLINICAL DATA:  Chest pain. EXAM: CHEST  2 VIEW  COMPARISON:  Chest x-ray dated 04/11/2017. FINDINGS: Heart size and mediastinal contours are normal. Lungs are clear. No pleural effusion or pneumothorax seen. Osseous structures about the chest are unremarkable. IMPRESSION: No active cardiopulmonary disease. Electronically Signed   By: Franki Cabot M.D.   On: 04/23/2017 23:56    Procedures Procedures (including critical care time)  Medications Ordered in ED Medications  sodium chloride 0.9 % bolus 1,000 mL (1,000 mLs Intravenous New Bag/Given 04/24/17 1830)  gi cocktail (Maalox,Lidocaine,Donnatal) (30 mLs Oral Given 04/24/17 1831)  famotidine (  PEPCID) IVPB 20 mg premix (0 mg Intravenous Stopped 04/24/17 1901)  ondansetron (ZOFRAN) injection 4 mg (4 mg Intravenous Given 04/24/17 1831)  potassium chloride SA (K-DUR,KLOR-CON) CR tablet 40 mEq (40 mEq Oral Given 04/24/17 1831)     Initial Impression / Assessment and Plan / ED Course  I have reviewed the triage vital signs and the nursing notes.  Pertinent labs & imaging results that were available during my care of the patient were reviewed by me and considered in my medical decision making (see chart for details).     41 y.o. female here with CP x2 days, states it feels different than prior MI; points to lower sternum/epigastric region as location of pain. Had some nausea as well. Came to ER yesterday and had labs/CXR but left before being seen. Labs yesterday showed glucose of 324 with bicarb of 19 and K 2.9 with anion gap 16; CBC with chronic thrombocytosis and very mildly elevated WBC 11.7; CXR neg, trop neg. Today on exam, no abdominal tenderness, neg murphy's, mild lower sternal tenderness just above the xyphoid, no tachycardia or hypoxia, no LE swelling. Work up today shows: EKG nonischemic and without acute findings, trop neg, CBC with stable thrombocytosis, and BMP with K 3.2 bicarb 21 glucose 263 and no ongoing anion gap elevation. Pt with no prior hx of DM2, last labs at PCP's office  showed glucose of 151 (no HgbA1C done that day), so this appears to be a new diagnosis. Given reproducible tenderness, likely musculoskeletal, vs GI related. Doubt need for repeat CXR. Will add-on LFTs, lipase, get U/A, and betaHCG; will replete potassium orally, give zofran, pepcid, GI cocktail, and fluids. Doubt need for abd U/S given lack of tenderness, doubt gallbladder etiology at this time, however still could be on differential if RUQ tenderness develops later; if LFTs abnormal, may pursue U/S. Will reassess after remainder of work up completed, but it appears she may have a new diagnosis of DM2.   8:03 PM LFTs and Lipase WNL. BetaHCG neg. Pt feeling much better, symptoms improved, tolerating PO well. At this time, awaiting U/A. As long as U/A shows no evidence of UTI or ketones, then she would be ready for discharge. Overall plan as follows: Pt with new onset diabetes, will start on metformin 528m BID for now, have her f/up with PCP for any adjustments; discussed starting with daily dose then advancing as tolerated with respect to any GI discomfort. Otherwise, overall symptoms consistent with gastritis/ulcer vs musculoskeletal pain. Discussed diet/lifestyle modifications for symptom management, will start on zantac/zofran, advised tylenol and avoidance/sparing use of NSAIDs only on full stomach, discussed other OTC remedies for symptomatic relief, ice/heat use discussed, and f/up with PCP in 5-7 days for recheck of symptoms and ongoing evaluation/management. Patient care to be resumed by KAntonietta Breach PA-C at shift change sign-out. Patient history has been discussed with midlevel resuming care. As long as U/A negative for aforementioned findings, then pt would be discharged with those instructions. Please see their notes for further documentation of pending results and dispo/care. Pt stable at sign-out and updated on transfer of care.     Final Clinical Impressions(s) / ED Diagnoses   Final  diagnoses:  Precordial chest pain  Epigastric pain  Nausea  Hypokalemia  [place] 2 diabetes mellitus [place]  Type 2 diabetes mellitus with hyperglycemia, without long-term current use of insulin (HCC)  Gastritis, presence of bleeding unspecified, unspecified chronicity, unspecified gastritis type    New Prescriptions New Prescriptions  METFORMIN (GLUCOPHAGE) 500 MG TABLET    Take 1 tablet (500 mg total) by mouth 2 (two) times daily with a meal.   ONDANSETRON (ZOFRAN ODT) 4 MG DISINTEGRATING TABLET    Take 1 tablet (4 mg total) by mouth every 8 (eight) hours as needed for nausea or vomiting.   RANITIDINE (ZANTAC) 150 MG TABLET    Take 1 tablet (150 mg total) by mouth 2 (two) times daily.     [address], [place], [place] 04/24/17 Becky Augusta, MD 04/25/17 1116

## 2017-04-24 NOTE — ED Notes (Signed)
Patient came to [facility] asking if her name had been called. She was outside. I explained she had been called and would be placed back in Lobby.

## 2017-04-24 NOTE — ED Notes (Signed)
Pt came to NF desk wanting to leave due to wait time. Pt was encouraged to stay by this NS and NF Tech. Pt seen walking out of lobby.

## 2017-07-06 ENCOUNTER — Other Ambulatory Visit: Payer: Self-pay | Admitting: Cardiovascular Disease

## 2017-07-15 ENCOUNTER — Other Ambulatory Visit: Payer: Self-pay | Admitting: Cardiovascular Disease

## 2017-09-15 ENCOUNTER — Other Ambulatory Visit: Payer: Self-pay | Admitting: Cardiovascular Disease

## 2017-09-25 DIAGNOSIS — E1165 Type 2 diabetes mellitus with hyperglycemia: Secondary | ICD-10-CM | POA: Insufficient documentation

## 2017-09-25 DIAGNOSIS — E1169 Type 2 diabetes mellitus with other specified complication: Secondary | ICD-10-CM | POA: Insufficient documentation

## 2017-09-25 DIAGNOSIS — E785 Hyperlipidemia, unspecified: Secondary | ICD-10-CM | POA: Insufficient documentation

## 2017-11-19 ENCOUNTER — Emergency Department (HOSPITAL_COMMUNITY)
Admission: EM | Admit: 2017-11-19 | Discharge: 2017-11-20 | Disposition: A | Discharge status: Home / Self Care | Payer: BC Managed Care – PPO | Attending: Emergency Medicine | Admitting: Emergency Medicine

## 2017-11-19 ENCOUNTER — Encounter (HOSPITAL_COMMUNITY): Payer: Self-pay

## 2017-11-19 ENCOUNTER — Other Ambulatory Visit: Payer: Self-pay

## 2017-11-19 DIAGNOSIS — Z87891 Personal history of nicotine dependence: Secondary | ICD-10-CM | POA: Insufficient documentation

## 2017-11-19 DIAGNOSIS — K0889 Other specified disorders of teeth and supporting structures: Secondary | ICD-10-CM | POA: Insufficient documentation

## 2017-11-19 DIAGNOSIS — Z7982 Long term (current) use of aspirin: Secondary | ICD-10-CM | POA: Diagnosis not present

## 2017-11-19 DIAGNOSIS — I252 Old myocardial infarction: Secondary | ICD-10-CM | POA: Insufficient documentation

## 2017-11-19 DIAGNOSIS — Z79899 Other long term (current) drug therapy: Secondary | ICD-10-CM | POA: Insufficient documentation

## 2017-11-19 DIAGNOSIS — I10 Essential (primary) hypertension: Secondary | ICD-10-CM | POA: Diagnosis not present

## 2017-11-19 MED ORDER — OXYCODONE-ACETAMINOPHEN 5-325 MG PO TABS
1.0000 | ORAL_TABLET | Freq: Once | ORAL | Status: AC
  Administered 2017-11-20: 1 via ORAL
  Filled 2017-11-19: qty 1

## 2017-11-19 MED ORDER — PENICILLIN V POTASSIUM 500 MG PO TABS
500.0000 mg | ORAL_TABLET | Freq: Once | ORAL | Status: AC
  Administered 2017-11-20: 500 mg via ORAL
  Filled 2017-11-19: qty 1

## 2017-11-19 NOTE — ED Notes (Signed)
Bed: WA04 Expected date:  Expected time:  Means of arrival:  Comments: 

## 2017-11-19 NOTE — ED Triage Notes (Signed)
Pt c/o of upper left dental pain that started two days ago. Reports she has taken an OTC numbing pain that initially helped but pain resumed. Reports pain is radiating to ear and left side of head.

## 2017-11-20 MED ORDER — PENICILLIN V POTASSIUM 500 MG PO TABS: 500.0000 mg | ORAL_TABLET | Freq: Three times a day (TID) | ORAL | 0 refills | Status: DC

## 2017-11-20 NOTE — ED Provider Notes (Signed)
[facility] DEPT Provider Note   CSN: [id number] Arrival date & time: 11/19/17  2130     History   Chief Complaint No chief complaint on file.   HPI [name] is a 42 y.o. female.  The history is provided by the patient.  Dental Pain   This is a new problem. The current episode started 2 days ago. The problem occurs daily. The problem has been gradually worsening. The pain is severe. Treatments tried: OTC meds. The treatment provided no relief.  Patient reports onset of left upper dental pain for the past 2 days.  The pain radiates to her left ear and her head.  No fevers or vomiting.  No chest pain or shortness of breath.  She denies any other complaints.  Past Medical History:  Diagnosis Date  . Coronary artery disease   . GERD (gastroesophageal reflux disease)   . Hyperlipidemia LDL goal <70   . Hypertension   . MI (myocardial infarction) [facility])    Nov 2010  . Obesity     Patient Active Problem List   Diagnosis Date Noted  . Coronary artery disease   . Hypertension   . Hyperlipidemia LDL goal <70   . TOBACCO ABUSE 04/06/2009  . HYPERLIPIDEMIA-MIXED 11/09/2008  . OBESITY, UNSPECIFIED 11/08/2008  . HYPERTENSION, UNSPECIFIED 11/08/2008  . CAD, UNSPECIFIED SITE 11/08/2008  . GERD 11/08/2008  . CHEST PAIN-UNSPECIFIED 11/08/2008    Past Surgical History:  Procedure Laterality Date  . CARDIAC CATHETERIZATION     Promus drug-eluting stent to the OM 1     OB History   None      Home Medications    Prior to Admission medications   Medication Sig Start Date End Date Taking? Authorizing Provider  acetaminophen (TYLENOL) 325 MG tablet Take 650 mg by mouth daily as needed for headache (pain).    [provider]  aspirin EC 81 MG tablet Take 1 tablet (81 mg total) by mouth daily. 01/06/11   Sherren Mocha, MD  Aspirin-Salicylamide-Caffeine [facility] HEADACHE POWDER PO) Take 1 packet by mouth daily as needed (headache).     [provider]  atorvastatin (LIPITOR) 20 MG tablet TAKE ONE TABLET BY MOUTH ONCE DAILY 07/16/17   Sherren Mocha, MD  cyclobenzaprine (FLEXERIL) 5 MG tablet Take 1 tablet (5 mg total) by mouth 3 (three) times daily as needed for muscle spasms. 04/11/17   Drenda Freeze, MD  esomeprazole (NEXIUM) 40 MG capsule Take 1 capsule (40 mg total) by mouth 2 (two) times daily before a meal. Patient taking differently: Take 40 mg by mouth daily before breakfast.  08/24/13   Jola Schmidt, MD  lisinopril-hydrochlorothiazide (PRINZIDE,ZESTORETIC) 20-12.5 MG tablet Take 2 tablets by mouth daily. 01/13/17   Sherren Mocha, MD  metFORMIN (GLUCOPHAGE) 500 MG tablet Take 1 tablet (500 mg total) by mouth 2 (two) times daily with a meal. 04/24/17   Street, [place], PA-C  metoprolol tartrate (LOPRESSOR) 50 MG tablet TAKE ONE TABLET BY MOUTH TWICE DAILY 07/06/17   Sherren Mocha, MD  Multiple Vitamin (MULTIVITAMIN WITH MINERALS) TABS tablet Take 1 tablet by mouth daily.    [provider]  ondansetron (ZOFRAN ODT) 4 MG disintegrating tablet Take 1 tablet (4 mg total) by mouth every 8 (eight) hours as needed for nausea or vomiting. 04/24/17   Street, [place], PA-C  penicillin v potassium (VEETID) 500 MG tablet Take 1 tablet (500 mg total) by mouth 3 (three) times daily. 11/19/17   Ripley Fraise, MD  Family History Family History  Problem Relation Age of Onset  . Hypertension Mother   . Stroke Maternal Grandmother   . Heart attack Maternal Grandfather     Social History Social History   Tobacco Use  . Smoking status: Former Research scientist (life sciences)  . Smokeless tobacco: Never Used  . Tobacco comment: 10 packs/year  Substance Use Topics  . Alcohol use: Yes    Comment: rarely   . Drug use: No     Allergies   Hydrocodone-acetaminophen   Review of Systems Review of Systems  Constitutional: Negative for fever.  HENT: Positive for dental problem. Negative for trouble swallowing.     Cardiovascular: Negative for chest pain.     Physical Exam Updated Vital Signs BP 102/69 (BP Location: Left Arm)   Pulse 98   Temp 99.2 F (37.3 C) (Oral)   Resp 18   Ht 1.575 m (5' 2" )   Wt 95.7 kg (211 lb)   LMP 11/15/2017 (Exact Date)   SpO2 96%   BMI 38.59 kg/m   Physical Exam CONSTITUTIONAL: Well developed/well nourished HEAD AND FACE: Normocephalic/atraumatic EYES: EOMI/PERRL ENMT: Mucous membranes moist.  Poor dentition.  No trismus.  No focal abscess noted. Ears symmetric, left TM clear and intact NECK: supple no meningeal signs CV: S1/S2 noted, no murmurs/rubs/gallops noted LUNGS: Lungs are clear to auscultation bilaterally, no apparent distress ABDOMEN: soft, nontender, no rebound or guarding NEURO: Pt is awake/alert, moves all extremitiesx4 smiling and watching television EXTREMITIES:full ROM SKIN: warm, color normal   ED Treatments / Results  Labs (all labs ordered are listed, but only abnormal results are displayed) Labs Reviewed - No data to display  EKG None  Radiology No results found.  Procedures Procedures (including critical care time)  Medications Ordered in ED Medications  penicillin v potassium (VEETID) tablet 500 mg (has no administration in time range)  oxyCODONE-acetaminophen (PERCOCET/ROXICET) 5-325 MG per tablet 1 tablet (has no administration in time range)     Initial Impression / Assessment and Plan / ED Course  I have reviewed the triage vital signs and the nursing notes.      She states she can tolerate Percocet but not a specific type of Vicodin. Refer to dentistry Final Clinical Impressions(s) / ED Diagnoses   Final diagnoses:  Pain, dental    ED Discharge Orders        Ordered    penicillin v potassium (VEETID) 500 MG tablet  3 times daily     11/20/17 0000       Ripley Fraise, MD 11/20/17 0002

## 2017-12-14 ENCOUNTER — Ambulatory Visit (INDEPENDENT_AMBULATORY_CARE_PROVIDER_SITE_OTHER): Payer: BC Managed Care – PPO | Admitting: Cardiovascular Disease

## 2017-12-14 ENCOUNTER — Encounter: Payer: Self-pay | Admitting: Cardiovascular Disease

## 2017-12-14 VITALS — BP 108/80 | HR 86 | Ht 62.0 in | Wt 205.8 lb

## 2017-12-14 DIAGNOSIS — E782 Mixed hyperlipidemia: Secondary | ICD-10-CM

## 2017-12-14 DIAGNOSIS — I2510 Atherosclerotic heart disease of native coronary artery without angina pectoris: Secondary | ICD-10-CM | POA: Diagnosis not present

## 2017-12-14 MED ORDER — ATORVASTATIN CALCIUM 20 MG PO TABS: 20.0000 mg | ORAL_TABLET | Freq: Every day | ORAL | 3 refills | Status: DC

## 2017-12-14 MED ORDER — LISINOPRIL-HYDROCHLOROTHIAZIDE 20-12.5 MG PO TABS: 2.0000 | ORAL_TABLET | Freq: Every day | ORAL | 3 refills | Status: DC

## 2017-12-14 MED ORDER — METOPROLOL TARTRATE 50 MG PO TABS: 50.0000 mg | ORAL_TABLET | Freq: Two times a day (BID) | ORAL | 3 refills | Status: DC

## 2017-12-14 NOTE — Progress Notes (Signed)
Cardiology Office Note Date:  12/14/2017   ID:  [name], DOB 02-13-1976, MRN [id number]  PCP:  [facility], [facility]  Cardiologist:  Sherren Mocha, MD    Chief Complaint  Patient presents with  . Follow-up    CAD     History of Present Illness: [name] is a 42 y.o. female who presents for annual cardiology follow-up of coronary artery disease.  The patient initially presented in 2009 with acute coronary syndrome and underwent stenting of the RCA and left circumflex. LV function is been normal. Last stress test in 2013 showed no ischemia.  The patient is here with her fiance today. Since her last visit here one year ago, she's been diagnosed with Type II DM and Crohn's disease. From a cardiac perspective she's been doing fine. Today, she denies symptoms of palpitations, chest pain, shortness of breath, orthopnea, PND, lower extremity edema, dizziness, or syncope.  She is no longer working for the [employer] 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   Past Medical History:  Diagnosis Date  . Coronary artery disease   . GERD (gastroesophageal reflux disease)   . Hyperlipidemia LDL goal <70   . Hypertension   . MI (myocardial infarction) [facility])    Nov 2010  . Obesity     Past Surgical History:  Procedure Laterality Date  . CARDIAC CATHETERIZATION     Promus drug-eluting stent to the OM 1    Current Outpatient Medications  Medication Sig Dispense Refill  . acetaminophen (TYLENOL) 325 MG tablet Take 650 mg by mouth daily as needed for headache (pain).    Marland Kitchen aspirin EC 81 MG tablet Take 1 tablet (81 mg total) by mouth daily.    . Aspirin-Salicylamide-Caffeine (BC HEADACHE POWDER PO) Take 1 packet by mouth daily as needed (headache).    Marland Kitchen atorvastatin (LIPITOR) 20 MG tablet Take 1 tablet (20 mg total) by mouth daily. 90 tablet 3  . cyclobenzaprine (FLEXERIL) 5 MG tablet Take 1 tablet (5 mg  total) by mouth 3 (three) times daily as needed for muscle spasms. 10 tablet 0  . esomeprazole (NEXIUM) 40 MG capsule Take 1 capsule (40 mg total) by mouth 2 (two) times daily before a meal. (Patient taking differently: Take 40 mg by mouth daily before breakfast. ) 60 capsule 0  . lisinopril-hydrochlorothiazide (PRINZIDE,ZESTORETIC) 20-12.5 MG tablet Take 2 tablets by mouth daily. 180 tablet 3  . metFORMIN (GLUCOPHAGE) 500 MG tablet Take 1 tablet (500 mg total) by mouth 2 (two) times daily with a meal. 60 tablet 0  . metoprolol tartrate (LOPRESSOR) 50 MG tablet Take 1 tablet (50 mg total) by mouth 2 (two) times daily. 180 tablet 3  . Multiple Vitamin (MULTIVITAMIN WITH MINERALS) TABS tablet Take 1 tablet by mouth daily.     No current facility-administered medications for this visit.     Allergies:   Hydrocodone-acetaminophen   Social History:  The patient  reports that she has quit smoking. She has never used smokeless tobacco. She reports that she drinks alcohol. She reports that she does not use drugs.   Family History:  The patient's family history includes Heart attack in her maternal grandfather; Hypertension in her mother; Stroke in her maternal grandmother.   ROS:  Please see the history of present illness.  All other systems are reviewed and negative.   PHYSICAL EXAM: VS:  BP 108/80   Pulse 86   Ht 5' 2"  (  1.575 m)   Wt 205 lb 12.8 oz (93.4 kg)   LMP 11/15/2017 (Exact Date)   SpO2 96%   BMI 37.64 kg/m  , BMI Body mass index is 37.64 kg/m. GEN: Well nourished, well developed, in no acute distress  HEENT: normal  Neck: no JVD, no masses. No carotid bruits Cardiac: RRR without murmur or gallop      Respiratory:  clear to auscultation bilaterally, normal work of breathing GI: soft, nontender, nondistended, + BS MS: no deformity or atrophy  Ext: no pretibial edema, pedal pulses 2+= bilaterally Skin: warm and dry, no rash Neuro:  Strength and sensation are intact Psych:  euthymic mood, full affect  EKG:  EKG is not ordered today.  Recent Labs: 04/24/2017: ALT 25; BUN 11; Creatinine, Ser 0.81; Hemoglobin 12.7; Platelets 416; Potassium 3.2; Sodium 132   Lipid Panel     Component Value Date/Time   CHOL 150 04/26/2012 0924   TRIG 193.0 (H) 04/26/2012 0924   HDL 35.10 (L) 04/26/2012 0924   CHOLHDL 4 04/26/2012 0924   VLDL 38.6 04/26/2012 0924   LDLCALC 76 04/26/2012 0924      Wt Readings from Last 3 Encounters:  12/14/17 205 lb 12.8 oz (93.4 kg)  11/19/17 211 lb (95.7 kg)  04/24/17 220 lb (99.8 kg)     ASSESSMENT AND PLAN: 1.  Coronary artery disease, native vessel, without angina: The patient appears to be doing very well from a cardiac perspective.  She will continue on aspirin, a statin drug, beta-blocker, and an ACE inhibitor in the setting of type 2 diabetes.  2.  Hyperlipidemia: Recent labs are reviewed through care everywhere demonstrating an LDL cholesterol of 78, total cholesterol 158, and HDL 42.  Her triglycerides are elevated at 189.  Lifestyle modification counseling is done.  3.  Hypertension: Blood pressure appears to be well controlled on lisinopril, hydrochlorothiazide, and metoprolol.  4.  Type 2 diabetes: Her initial hemoglobin A1c from November 2018 is 11.9.  3 months later her hemoglobin A1c is 6.7.  She has lost 20 pounds since her office visit here 1 year ago.  I encouraged her to continue to work on lifestyle, exercise, and weight loss.  She has done a nice job of getting her diabetes under control.  She is managed by her primary physician.  Current medicines are reviewed with the patient today.  The patient does not have concerns regarding medicines.  Labs/ tests ordered today include:  No orders of the defined types were placed in this encounter.  Disposition:   FU one year  Signed, Sherren Mocha, MD  12/14/2017 1:58 PM    [facility] [address], [place], [place]  [address] Phone: [phone]; Fax: [phone]

## 2017-12-14 NOTE — Patient Instructions (Signed)
Medication Instructions:  Your provider recommends that you continue on your current medications as directed. Please refer to the Current Medication list given to you today.    Labwork: None  Testing/Procedures: None  Follow-Up: Your provider wants you to follow-up in: 1 year with Dr. Burt Knack. You will receive a reminder letter in the mail two months in advance. If you don't receive a letter, please call our office to schedule the follow-up appointment.    Any Other Special Instructions Will Be Listed Below (If Applicable).     If you need a refill on your cardiac medications before your next appointment, please call your pharmacy.

## 2018-06-17 DIAGNOSIS — K5080 Crohn's disease of both small and large intestine without complications: Secondary | ICD-10-CM | POA: Insufficient documentation

## 2018-06-19 IMAGING — DX DG CHEST 2V
2 series · 2 of 2 positions shown · non-contrast
Comparison: Chest x-ray dated 04/11/2017.

CLINICAL DATA: Chest pain.

EXAM:
CHEST  2 VIEW

[chest pa]
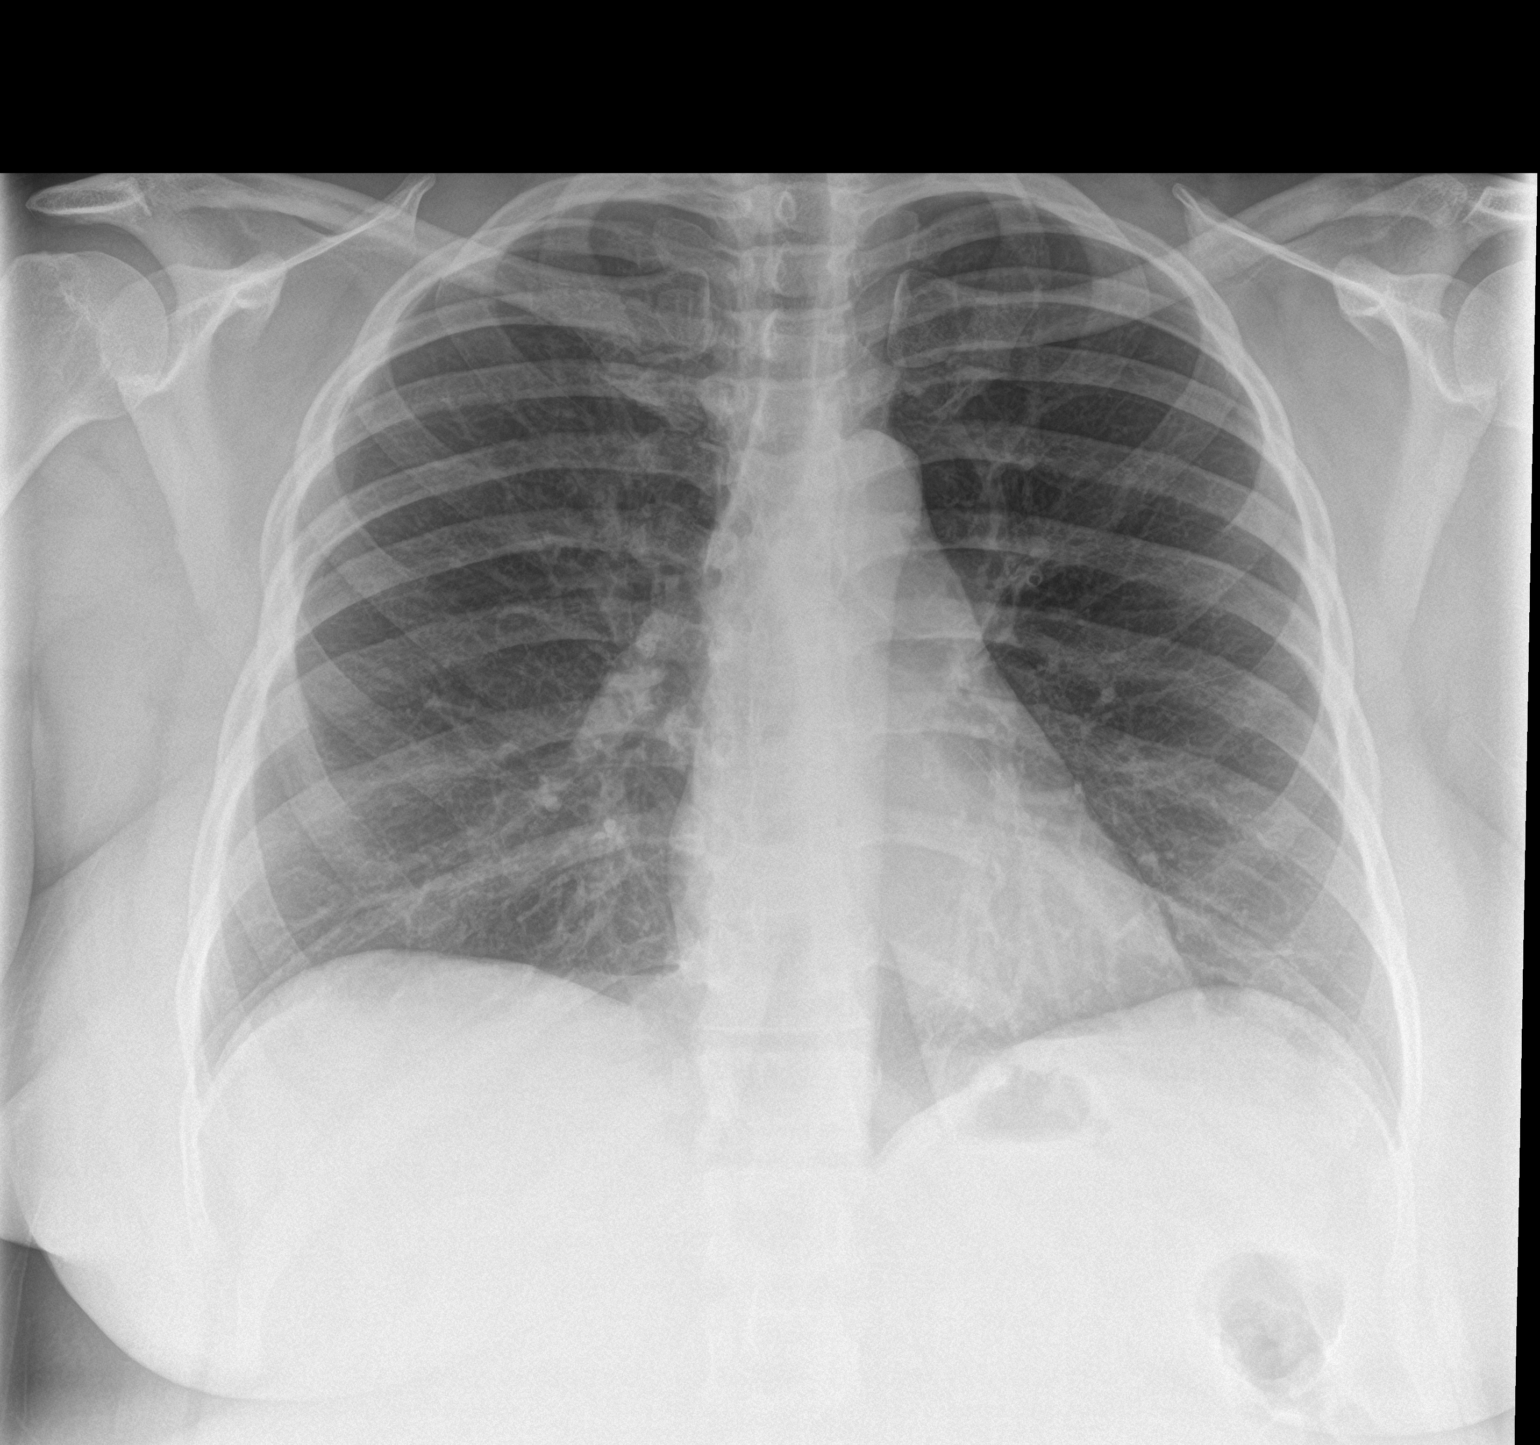

[chest lat]
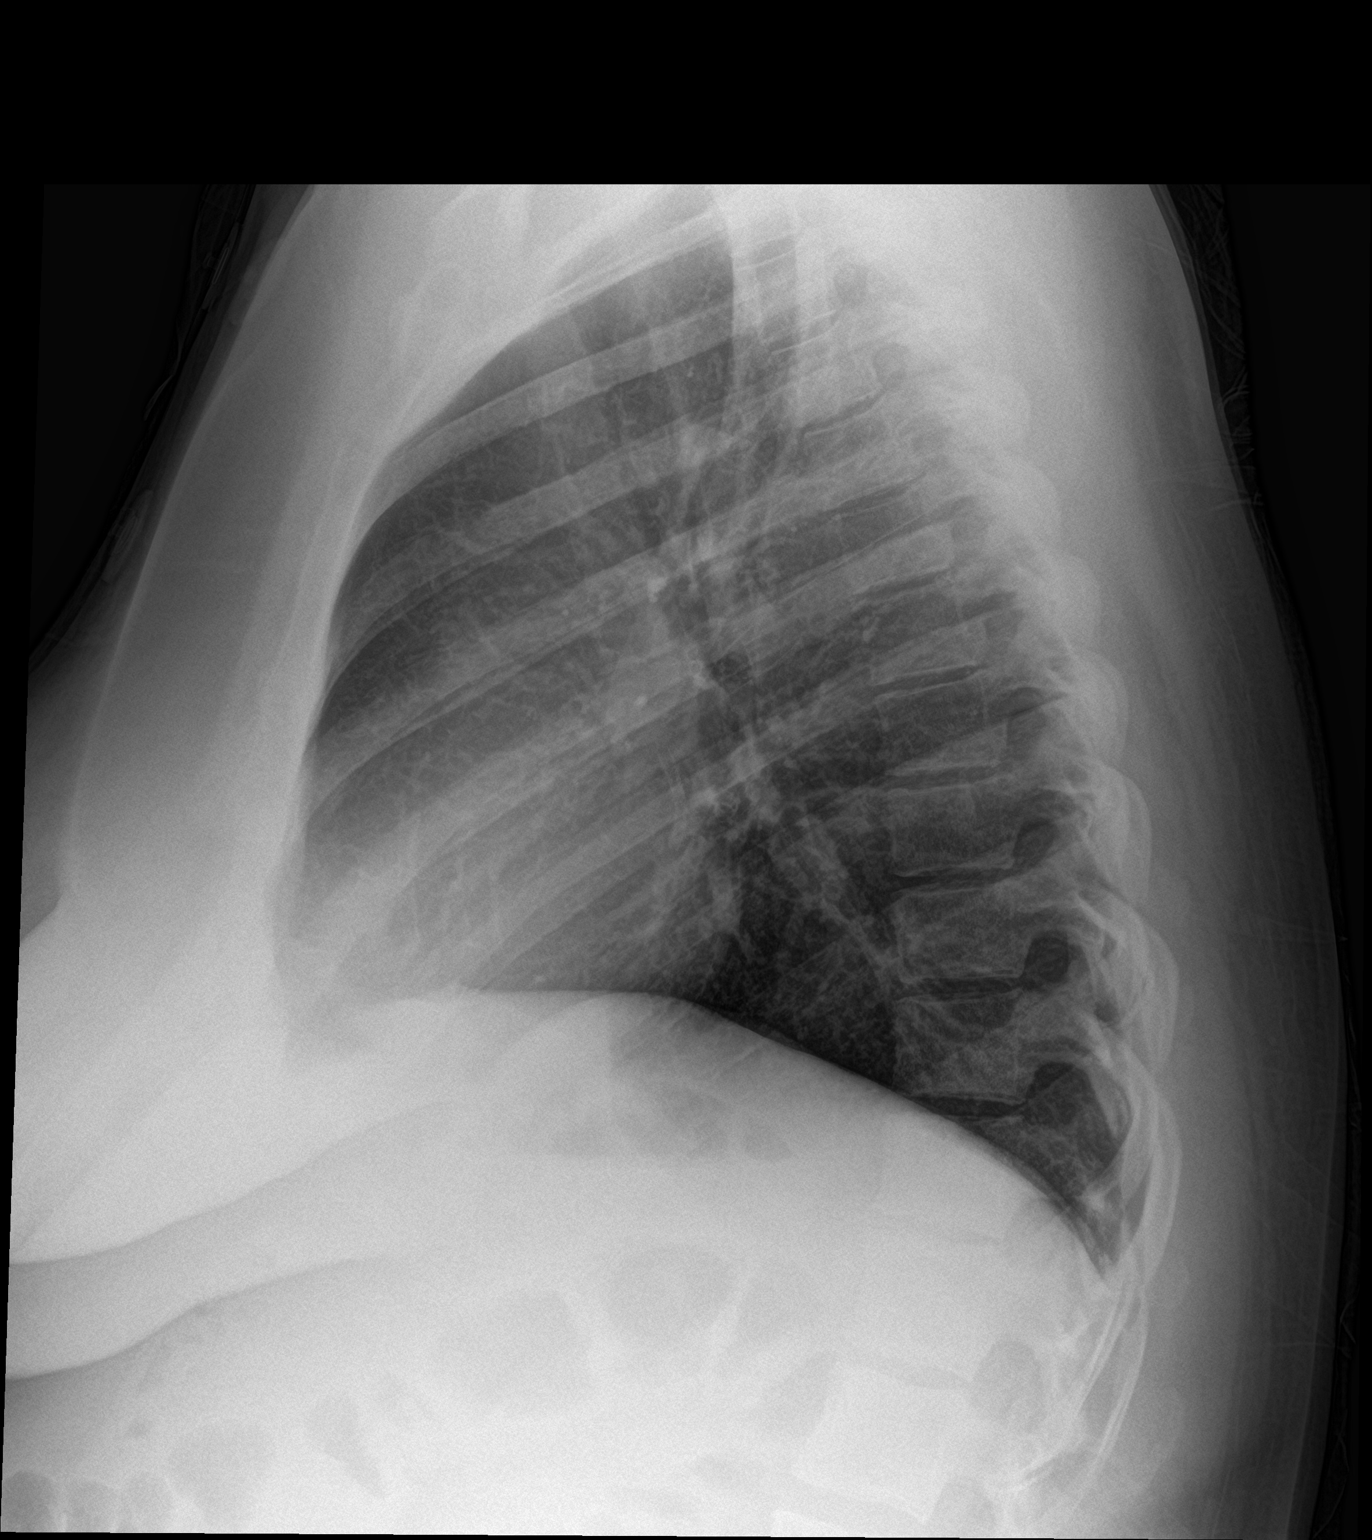

[2 of 2 positions shown; findings below may reference images not displayed]

FINDINGS: Heart size and mediastinal contours are normal. Lungs are clear. No
pleural effusion or pneumothorax seen. Osseous structures about the
chest are unremarkable.
IMPRESSION: No active cardiopulmonary disease.

## 2018-12-14 ENCOUNTER — Other Ambulatory Visit: Payer: Self-pay

## 2018-12-14 ENCOUNTER — Telehealth (INDEPENDENT_AMBULATORY_CARE_PROVIDER_SITE_OTHER): Payer: BC Managed Care – PPO | Admitting: Physician Assistant

## 2018-12-14 ENCOUNTER — Encounter: Payer: Self-pay | Admitting: Physician Assistant

## 2018-12-14 VITALS — BP 107/76 | HR 78 | Ht 62.0 in | Wt 210.0 lb

## 2018-12-14 DIAGNOSIS — I2510 Atherosclerotic heart disease of native coronary artery without angina pectoris: Secondary | ICD-10-CM | POA: Diagnosis not present

## 2018-12-14 DIAGNOSIS — K50919 Crohn's disease, unspecified, with unspecified complications: Secondary | ICD-10-CM

## 2018-12-14 DIAGNOSIS — I10 Essential (primary) hypertension: Secondary | ICD-10-CM

## 2018-12-14 DIAGNOSIS — E785 Hyperlipidemia, unspecified: Secondary | ICD-10-CM

## 2018-12-14 DIAGNOSIS — Z7189 Other specified counseling: Secondary | ICD-10-CM

## 2018-12-14 DIAGNOSIS — E119 Type 2 diabetes mellitus without complications: Secondary | ICD-10-CM

## 2018-12-14 MED ORDER — ATORVASTATIN CALCIUM 20 MG PO TABS: 20.0000 mg | ORAL_TABLET | Freq: Every day | ORAL | 3 refills | Status: DC

## 2018-12-14 MED ORDER — LISINOPRIL-HYDROCHLOROTHIAZIDE 20-12.5 MG PO TABS: 2.0000 | ORAL_TABLET | Freq: Every day | ORAL | 3 refills | Status: DC

## 2018-12-14 MED ORDER — METOPROLOL TARTRATE 50 MG PO TABS: 50.0000 mg | ORAL_TABLET | Freq: Two times a day (BID) | ORAL | 3 refills | Status: DC

## 2018-12-14 NOTE — Patient Instructions (Signed)
Medication Instructions:  No changes.  I have sent in refills to your pharmacy for atorvastatin, lisinopril/HCTZ and metoprolol.  If you need a refill on your cardiac medications before your next appointment, please call your pharmacy.   Lab work: We will arrange follow-up fasting lipids and LFTs in 3 months.  If you have labs (blood work) drawn today and your tests are completely normal, you will receive your results only by: Marland Kitchen MyChart Message (if you have MyChart) OR . A paper copy in the mail If you have any lab test that is abnormal or we need to change your treatment, we will call you to review the results.  Testing/Procedures: None  Follow-Up: At [facility], you and your health needs are our priority.  As part of our continuing mission to provide you with exceptional heart care, we have created designated Provider Care Teams.  These Care Teams include your primary Cardiologist (physician) and Advanced Practice Providers (APPs -  Physician Assistants and Nurse Practitioners) who all work together to provide you with the care you need, when you need it. You will need a follow up appointment in:  12 months.  Please call our office 2 months in advance to schedule this appointment.  You may see Sherren Mocha, MD or Richardson Dopp, PA-C   Any Other Special Instructions Will Be Listed Below (If Applicable).

## 2018-12-14 NOTE — Progress Notes (Signed)
Virtual Visit via Video Note   This visit type was conducted due to national recommendations for restrictions regarding the COVID-19 Pandemic (e.g. social distancing) in an effort to limit this patient's exposure and mitigate transmission in our community.  Due to her co-morbid illnesses, this patient is at least at moderate risk for complications without adequate follow up.  This format is felt to be most appropriate for this patient at this time.  All issues noted in this document were discussed and addressed.  A limited physical exam was performed with this format.  Please refer to the patient's chart for her consent to telehealth for [facility].   Date:  12/14/2018   ID:  TAKOYA Decker, DOB 25-Oct-1975, MRN [id number]  Patient Location: Home Provider Location: Home  PCP:  [facility], [facility]  Cardiologist:  Sherren Mocha, MD  Electrophysiologist:  None   Evaluation Performed:  Follow-Up Visit  Chief Complaint: Follow-up on coronary artery disease  History of Present Illness:    [name] is a 43 y.o. female with coronary artery disease status post acute coronary syndrome in 2009 treated with stenting to the RCA and LCx, diabetes, Crohn's disease (managed with Remicade), hypertension, hyperlipidemia.  She was last seen by Dr. Burt Knack in May 2019.  Today, she notes she is doing well.  Unfortunately she is been out of work since school close.  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  She has not had any chest discomfort, significant shortness of breath, orthopnea, paroxysmal trouble dyspnea or lower extremity edema.  She has not had any syncope.  The patient does not have symptoms concerning for COVID-19 infection (fever, chills, cough, or new shortness of breath).    Past Medical History:  Diagnosis Date  . Coronary artery disease   . GERD (gastroesophageal reflux disease)   . Hyperlipidemia LDL goal <70   .  Hypertension   . MI (myocardial infarction) [facility])    Nov 2010  . Obesity    Past Surgical History:  Procedure Laterality Date  . CARDIAC CATHETERIZATION     Promus drug-eluting stent to the OM 1     Current Meds  Medication Sig  . acetaminophen (TYLENOL) 325 MG tablet Take 650 mg by mouth daily as needed for headache (pain).  Marland Kitchen aspirin EC 81 MG tablet Take 1 tablet (81 mg total) by mouth daily.  Marland Kitchen atorvastatin (LIPITOR) 20 MG tablet Take 1 tablet (20 mg total) by mouth daily.  . Dapagliflozin-metFORMIN HCl ER (XIGDUO XR) 12-998 MG TB24 Take 2 tablets by mouth daily.  Marland Kitchen esomeprazole (NEXIUM) 40 MG capsule Take 1 capsule (40 mg total) by mouth 2 (two) times daily before a meal. (Patient taking differently: Take 40 mg by mouth daily before breakfast. )  . lisinopril-hydrochlorothiazide (ZESTORETIC) 20-12.5 MG tablet Take 2 tablets by mouth daily.  . metFORMIN (GLUCOPHAGE) 500 MG tablet Take 1 tablet (500 mg total) by mouth 2 (two) times daily with a meal.  . metoprolol tartrate (LOPRESSOR) 50 MG tablet Take 1 tablet (50 mg total) by mouth 2 (two) times daily.  . Multiple Vitamin (MULTIVITAMIN WITH MINERALS) TABS tablet Take 1 tablet by mouth daily.     Allergies:   Hydrocodone-acetaminophen   Social History   Tobacco Use  . Smoking status: Former Research scientist (life sciences)  . Smokeless tobacco: Never Used  . Tobacco comment: 10 packs/year  Substance Use Topics  . Alcohol use: Yes    Comment: rarely   .  Drug use: No     Family Hx: The patient's family history includes Heart attack in her maternal grandfather; Hypertension in her mother; Stroke in her maternal grandmother.  ROS:   Please see the history of present illness.     All other systems reviewed and are negative.   Prior CV studies:   The following studies were reviewed today:  Myoview 09/05/2011 EF 82, no scar or ischemia, normal study  Cardiac catheterization 06/21/2008 LM patent LAD proximal 30, mid to distal 40-50; D1 proximal  30-40 LCx mid 50, 90, AV groove occluded; right to left and left to left collaterals RCA mid 80-90; acute marginal ostial 90 EF 60 PCI: 2.5 x 18 mm Promus DES and 2.75 x 12 mm Promus DES to the OM1 (overlapping) PCI: 3.5 x 28 mm Promus DES to the mid RCA   Labs/Other Tests and Data Reviewed:    EKG:  No ECG reviewed.  Recent Labs: No results found for requested labs within last 8760 hours.   Recent Lipid Panel Lab Results  Component Value Date/Time   CHOL 150 04/26/2012 09:24 AM   TRIG 193.0 (H) 04/26/2012 09:24 AM   HDL 35.10 (L) 04/26/2012 09:24 AM   CHOLHDL 4 04/26/2012 09:24 AM   LDLCALC 76 04/26/2012 09:24 AM       Wt Readings from Last 3 Encounters:  12/14/18 210 lb (95.3 kg)  12/14/17 205 lb 12.8 oz (93.4 kg)  11/19/17 211 lb (95.7 kg)     Objective:    Vital Signs:  BP 107/76   Pulse 78   Ht 5' 2"  (1.575 m)   Wt 210 lb (95.3 kg)   BMI 38.41 kg/m    VITAL SIGNS:  reviewed GEN:  no acute distress EYES:  sclerae anicteric, EOMI - Extraocular Movements Intact RESPIRATORY:  Normal respiratory effort NEURO:  alert and oriented x 3, no obvious focal deficit PSYCH:  normal affect  ASSESSMENT & PLAN:    Coronary artery disease involving native coronary artery of native heart without angina pectoris - History of myocardial infarction 2009 treated with drug-eluting stent to the RCA and LCx.  She is doing well without anginal symptoms.  Continue aspirin, statin, beta-blocker.  Essential hypertension The patient's blood pressure is controlled on her current regimen.  Continue current therapy.   Hyperlipidemia LDL goal <70  Most recent LDL 92.  Continue current therapy.  Arrange follow-up fasting lipids in 3 months.  If her LDL remains >70, increase atorvastatin to 40 mg daily.  Crohn's disease with complication, unspecified gastrointestinal tract location [facility]) Currently on Remicade therapy.  Educated About Covid-19 Virus Infection The signs and symptoms of  COVID-19 were discussed with the patient and how to seek care for testing (follow up with PCP or arrange E-visit).  The importance of social distancing was discussed today.  Time:   Today, I have spent 11 minutes with the patient with telehealth technology discussing the above problems.     Medication Adjustments/Labs and Tests Ordered: Current medicines are reviewed at length with the patient today.  Concerns regarding medicines are outlined above.   Tests Ordered: Orders Placed This Encounter  Procedures  . Hepatic function panel  . Lipid panel    Medication Changes: Meds ordered this encounter  Medications  . atorvastatin (LIPITOR) 20 MG tablet    Sig: Take 1 tablet (20 mg total) by mouth daily.    Dispense:  90 tablet    Refill:  3    Rx renewal -  patient will call when ready for refills    Order Specific Question:   Supervising Provider    Answer:   Lelon Perla [[id number]]  . lisinopril-hydrochlorothiazide (ZESTORETIC) 20-12.5 MG tablet    Sig: Take 2 tablets by mouth daily.    Dispense:  180 tablet    Refill:  3    Rx renewal - patient will call when ready for refills    Order Specific Question:   Supervising Provider    Answer:   Lelon Perla [[id number]]  . metoprolol tartrate (LOPRESSOR) 50 MG tablet    Sig: Take 1 tablet (50 mg total) by mouth 2 (two) times daily.    Dispense:  180 tablet    Refill:  3    Rx renewal - patient will call when ready for refills    Order Specific Question:   Supervising Provider    Answer:   Lelon Perla [[id number]]    Disposition:  Follow up in 1 year(s)  Signed, Richardson Dopp, PA-C  12/14/2018 4:47 PM    [place]

## 2018-12-15 DIAGNOSIS — K439 Ventral hernia without obstruction or gangrene: Secondary | ICD-10-CM | POA: Insufficient documentation

## 2019-10-08 ENCOUNTER — Other Ambulatory Visit: Payer: Self-pay

## 2019-10-08 ENCOUNTER — Encounter (HOSPITAL_COMMUNITY): Payer: Self-pay

## 2019-10-08 ENCOUNTER — Emergency Department (HOSPITAL_COMMUNITY)
Admission: EM | Admit: 2019-10-08 | Discharge: 2019-10-08 | Disposition: A | Discharge status: Home / Self Care | Payer: BC Managed Care – PPO | Attending: Emergency Medicine | Admitting: Emergency Medicine

## 2019-10-08 DIAGNOSIS — M7918 Myalgia, other site: Secondary | ICD-10-CM

## 2019-10-08 DIAGNOSIS — Z7984 Long term (current) use of oral hypoglycemic drugs: Secondary | ICD-10-CM | POA: Insufficient documentation

## 2019-10-08 DIAGNOSIS — I252 Old myocardial infarction: Secondary | ICD-10-CM | POA: Insufficient documentation

## 2019-10-08 DIAGNOSIS — Y9389 Activity, other specified: Secondary | ICD-10-CM | POA: Diagnosis not present

## 2019-10-08 DIAGNOSIS — M25511 Pain in right shoulder: Secondary | ICD-10-CM | POA: Diagnosis not present

## 2019-10-08 DIAGNOSIS — M542 Cervicalgia: Secondary | ICD-10-CM | POA: Diagnosis not present

## 2019-10-08 DIAGNOSIS — Z87891 Personal history of nicotine dependence: Secondary | ICD-10-CM | POA: Insufficient documentation

## 2019-10-08 DIAGNOSIS — E119 Type 2 diabetes mellitus without complications: Secondary | ICD-10-CM | POA: Insufficient documentation

## 2019-10-08 DIAGNOSIS — M549 Dorsalgia, unspecified: Secondary | ICD-10-CM | POA: Diagnosis present

## 2019-10-08 DIAGNOSIS — Z79899 Other long term (current) drug therapy: Secondary | ICD-10-CM | POA: Insufficient documentation

## 2019-10-08 DIAGNOSIS — Y998 Other external cause status: Secondary | ICD-10-CM | POA: Insufficient documentation

## 2019-10-08 DIAGNOSIS — R519 Headache, unspecified: Secondary | ICD-10-CM | POA: Diagnosis not present

## 2019-10-08 DIAGNOSIS — Y92410 Unspecified street and highway as the place of occurrence of the external cause: Secondary | ICD-10-CM | POA: Diagnosis not present

## 2019-10-08 DIAGNOSIS — Z7982 Long term (current) use of aspirin: Secondary | ICD-10-CM | POA: Insufficient documentation

## 2019-10-08 DIAGNOSIS — I10 Essential (primary) hypertension: Secondary | ICD-10-CM | POA: Insufficient documentation

## 2019-10-08 MED ORDER — METHOCARBAMOL 500 MG PO TABS
500.0000 mg | ORAL_TABLET | Freq: Once | ORAL | Status: AC
  Administered 2019-10-08: 500 mg via ORAL
  Filled 2019-10-08: qty 1

## 2019-10-08 MED ORDER — NAPROXEN 375 MG PO TABS: 375.0000 mg | ORAL_TABLET | Freq: Two times a day (BID) | ORAL | 0 refills | Status: AC

## 2019-10-08 MED ORDER — NAPROXEN 375 MG PO TABS
375.0000 mg | ORAL_TABLET | Freq: Once | ORAL | Status: AC
  Administered 2019-10-08: 375 mg via ORAL
  Filled 2019-10-08: qty 1

## 2019-10-08 MED ORDER — METHOCARBAMOL 500 MG PO TABS: 500.0000 mg | ORAL_TABLET | Freq: Two times a day (BID) | ORAL | 0 refills | Status: AC
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
## 2019-10-08 NOTE — ED Notes (Signed)
Pt verbalizes understanding of DC instructions. Pt belongings returned and is ambulatory out of ED.  
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
## 2019-11-12 ENCOUNTER — Ambulatory Visit: Payer: BC Managed Care – PPO | Attending: Internal Medicine

## 2019-11-12 DIAGNOSIS — Z23 Encounter for immunization: Secondary | ICD-10-CM

## 2019-11-12 NOTE — Progress Notes (Signed)
   Covid-19 Vaccination Clinic  Name:  [name]    MRN: [id number] DOB: Aug 24, 1975  11/12/2019  [name] was observed post Covid-19 immunization for 15 minutes without incident. She was provided with Vaccine Information Sheet and instruction to access the V-Safe system.   [name] was instructed to call 911 with any severe reactions post vaccine: Marland Kitchen Difficulty breathing  . Swelling of face and throat  . A fast heartbeat  . A bad rash all over body  . Dizziness and weakness   Immunizations Administered    Name Date Dose VIS Date Route   Moderna COVID-19 Vaccine 11/12/2019 10:56 AM 0.5 mL 07/05/2019 Intramuscular   Manufacturer: Levan Hurst   Lot: [id number]   [place]: [id number]

## 2019-12-10 ENCOUNTER — Ambulatory Visit: Payer: BC Managed Care – PPO | Attending: Internal Medicine

## 2019-12-10 DIAGNOSIS — Z23 Encounter for immunization: Secondary | ICD-10-CM

## 2019-12-10 NOTE — Progress Notes (Signed)
   Covid-19 Vaccination Clinic  Name:  [name]    MRN: [id number] DOB: 01/20/1976  12/10/2019  Ms. [name] was observed post Covid-19 immunization for 15 minutes without incident. She was provided with Vaccine Information Sheet and instruction to access the V-Safe system.   Ms. [name] was instructed to call 911 with any severe reactions post vaccine: Marland Kitchen Difficulty breathing  . Swelling of face and throat  . A fast heartbeat  . A bad rash all over body  . Dizziness and weakness   Immunizations Administered    Name Date Dose VIS Date Route   Moderna COVID-19 Vaccine 12/10/2019  9:10 AM 0.5 mL 07/2019 Intramuscular   Manufacturer: Moderna   Lot: [id number]   [place]: [id number]

## 2020-01-21 ENCOUNTER — Other Ambulatory Visit: Payer: Self-pay | Admitting: Physician Assistant

## 2020-02-16 ENCOUNTER — Other Ambulatory Visit: Payer: Self-pay | Admitting: Physician Assistant

## 2020-02-24 ENCOUNTER — Other Ambulatory Visit: Payer: Self-pay | Admitting: Physician Assistant

## 2020-03-05 NOTE — Progress Notes (Signed)
Cardiology Office Note:    Date:  03/06/2020   ID:  [name], DOB 02/13/1976, MRN [id number]  PCP:  [facility], [facility]  Cardiologist:  Sherren Mocha, MD   Electrophysiologist:  None   Referring MD: Medicine, [facility]*   Chief Complaint:  Follow-up (CAD)    Patient Profile:    [name] is a 44 y.o. female with:   Coronary artery disease   S/p NSTEMI in 2009 tx with DES to RCA and OM1  Diabetes mellitus   Crohn's disease  Hypertension   Hyperlipidemia    Prior CV studies: Myoview 09/05/2011 EF 82, no scar or ischemia, normal study  Cardiac catheterization 06/21/2008 LM patent LAD proximal 30, mid to distal 40-50; D1 proximal 30-40 LCx mid 50, 90, AV groove occluded; right to left and left to left collaterals RCA mid 80-90; acute marginal ostial 90 EF 60 PCI: 2.5 x 18 mm Promus DES and 2.75 x 12 mm Promus DES to the OM1 (overlapping) PCI: 3.5 x 28 mm Promus DES to the mid RCA  History of Present Illness:    [name] was last seen in 12/2018 via Telemedicine.  She returns for follow up.  She denies chest pain, shortness of breath, syncope, orthopnea, PND or significant pedal edema.    Past Medical History:  Diagnosis Date  . Coronary artery disease   . GERD (gastroesophageal reflux disease)   . Hyperlipidemia LDL goal <70   . Hypertension   . MI (myocardial infarction) [facility])    Nov 2010  . Obesity     Current Medications: Current Meds  Medication Sig  . acetaminophen (TYLENOL) 325 MG tablet Take 650 mg by mouth daily as needed for headache (pain).  Marland Kitchen aspirin EC 81 MG tablet Take 1 tablet (81 mg total) by mouth daily.  . Dapagliflozin-metFORMIN HCl ER (XIGDUO XR) 12-998 MG TB24 Take 2 tablets by mouth daily.  Marland Kitchen esomeprazole (NEXIUM) 40 MG capsule Take 1 capsule (40 mg total) by mouth 2 (two) times daily before a meal.  . inFLIXimab (REMICADE) 100 MG injection Inject 100 mg into the vein every 8 (eight)  weeks.  Marland Kitchen lisinopril-hydrochlorothiazide (ZESTORETIC) 20-12.5 MG tablet Take 2 tablets by mouth daily. Please make overdue appt with Dr. Burt Knack before anymore refills. 1st attempt  . metoprolol tartrate (LOPRESSOR) 50 MG tablet Take 1 tablet by mouth twice daily  . Multiple Vitamin (MULTIVITAMIN WITH MINERALS) TABS tablet Take 1 tablet by mouth daily.  . potassium chloride (KLOR-CON) 10 MEQ tablet Take 10 mEq by mouth daily.   . [DISCONTINUED] atorvastatin (LIPITOR) 20 MG tablet Take 1 tablet (20 mg total) by mouth daily. Must keep appt for further refills     Allergies:   Hydrocodone-acetaminophen   Social History   Tobacco Use  . Smoking status: Former Research scientist (life sciences)  . Smokeless tobacco: Never Used  . Tobacco comment: 10 packs/year  Substance Use Topics  . Alcohol use: Yes    Comment: rarely   . Drug use: No     Family Hx: The patient's family history includes Heart attack in her maternal grandfather; Hypertension in her mother; Stroke in her maternal grandmother.  ROS   EKGs/Labs/Other Test Reviewed:    EKG:  EKG is   ordered today.  The ekg ordered today demonstrates normal sinus rhythm, heart rate 70, normal axis, septal Q waves, QTC 423, no ST-T wave changes, no change from prior tracing  Recent Labs: No results found for requested labs within  last 8760 hours.   Recent Lipid Panel Lab Results  Component Value Date/Time   CHOL 150 04/26/2012 09:24 AM   TRIG 193.0 (H) 04/26/2012 09:24 AM   HDL 35.10 (L) 04/26/2012 09:24 AM   CHOLHDL 4 04/26/2012 09:24 AM   LDLCALC 76 04/26/2012 09:24 AM    Physical Exam:    VS:  BP 110/80 (BP Location: Left Arm, Patient Position: Sitting, Cuff Size: Normal)   Pulse 70   Ht 5' 2"  (1.575 m)   Wt 205 lb (93 kg)   SpO2 98%   BMI 37.49 kg/m     Wt Readings from Last 3 Encounters:  03/06/20 205 lb (93 kg)  12/14/18 210 lb (95.3 kg)  12/14/17 205 lb 12.8 oz (93.4 kg)     Constitutional:      Appearance: Healthy appearance. Not in  distress.  Neck:     Thyroid: No thyromegaly.     Vascular: No carotid bruit. JVD normal.  Pulmonary:     Effort: Pulmonary effort is normal.     Breath sounds: No wheezing. No rales.  Cardiovascular:     Normal rate. Regular rhythm. Normal S1. Normal S2.     Murmurs: There is no murmur.  Edema:    Peripheral edema absent.  Abdominal:     Palpations: Abdomen is soft. There is no hepatomegaly.  Skin:    General: Skin is warm and dry.  Neurological:     Mental Status: Alert and oriented to person, place and time.     Cranial Nerves: Cranial nerves are intact.       ASSESSMENT & PLAN:    1. Coronary artery disease involving native coronary artery of native heart without angina pectoris History of non-STEMI in 2009 treated with a drug-eluting stent to the RCA and drug-eluting stent x2 to the OM1.  Myoview in 2013 was negative for ischemia.  She is currently doing well without anginal symptoms.  Continue aspirin, statin therapy, metoprolol tartrate.  Follow-up in 1 year.  2. Essential hypertension The patient's blood pressure is controlled on her current regimen.  Continue current therapy.   3. Hyperlipidemia LDL goal <70 Recent labs from primary care personally reviewed and interpreted.  11/08/2019: Cholesterol 173, triglycerides 301, HDL 38, LDL 85.  We discussed the importance of aggressive lipid management.  Her LDL should really be less than 70 and triglycerides less than 150.  Therefore, I will discontinue her atorvastatin.  I will place her on rosuvastatin 20 mg daily.  If her triglycerides do not reach goal, we can consider adding Vascepa.  4. Type 2 diabetes mellitus without complication, without long-term current use of insulin (HCC) Well controlled.  Recent A1c 6.6.  Continue follow-up with primary care.     Dispo:  No follow-ups on file.   Medication Adjustments/Labs and Tests Ordered: Current medicines are reviewed at length with the patient today.  Concerns regarding  medicines are outlined above.  Tests Ordered: Orders Placed This Encounter  Procedures  . Lipid panel  . Hepatic function panel  . EKG 12-Lead   Medication Changes: Meds ordered this encounter  Medications  . rosuvastatin (CRESTOR) 20 MG tablet    Sig: Take 1 tablet (20 mg total) by mouth daily.    Dispense:  90 tablet    Refill:  3    Signed, Richardson Dopp, PA-C  03/06/2020 10:28 AM    [facility] [address], [place], [place]  [address] Phone: [phone]; Fax: [phone]    

## 2020-03-06 ENCOUNTER — Encounter: Payer: Self-pay | Admitting: Physician Assistant

## 2020-03-06 ENCOUNTER — Ambulatory Visit (INDEPENDENT_AMBULATORY_CARE_PROVIDER_SITE_OTHER): Payer: BC Managed Care – PPO | Admitting: Physician Assistant

## 2020-03-06 ENCOUNTER — Other Ambulatory Visit: Payer: Self-pay

## 2020-03-06 VITALS — BP 110/80 | HR 70 | Ht 62.0 in | Wt 205.0 lb

## 2020-03-06 DIAGNOSIS — I10 Essential (primary) hypertension: Secondary | ICD-10-CM

## 2020-03-06 DIAGNOSIS — E785 Hyperlipidemia, unspecified: Secondary | ICD-10-CM

## 2020-03-06 DIAGNOSIS — I2510 Atherosclerotic heart disease of native coronary artery without angina pectoris: Secondary | ICD-10-CM | POA: Diagnosis not present

## 2020-03-06 DIAGNOSIS — E119 Type 2 diabetes mellitus without complications: Secondary | ICD-10-CM | POA: Diagnosis not present

## 2020-03-06 MED ORDER — ROSUVASTATIN CALCIUM 20 MG PO TABS: 20.0000 mg | ORAL_TABLET | Freq: Every day | ORAL | 3 refills | Status: DC

## 2020-03-06 NOTE — Patient Instructions (Signed)
Medication Instructions:  Your physician has recommended you make the following change in your medication:   1) Stop Atorvastatin 2) Start Rosuvastatin 20 mg, 1 tablet by mouth once a day  *If you need a refill on your cardiac medications before your next appointment, please call your pharmacy*  Lab Work: Your physician recommends that you return for lab work in 3 months on 06/08/20  Testing/Procedures: None ordered today  Follow-Up: At [facility], you and your health needs are our priority.  As part of our continuing mission to provide you with exceptional heart care, we have created designated Provider Care Teams.  These Care Teams include your primary Cardiologist (physician) and Advanced Practice Providers (APPs -  Physician Assistants and Nurse Practitioners) who all work together to provide you with the care you need, when you need it.  We recommend signing up for the patient portal called "MyChart".  Sign up information is provided on this After Visit Summary.  MyChart is used to connect with patients for Virtual Visits (Telemedicine).  Patients are able to view lab/test results, encounter notes, upcoming appointments, etc.  Non-urgent messages can be sent to your provider as well.   To learn more about what you can do with MyChart, go to [email or web address].    Your next appointment:   12 month(s)  The format for your next appointment:   In Person  Provider:   You may see Sherren Mocha, MD or Richardson Dopp, PA-C

## 2020-03-23 ENCOUNTER — Other Ambulatory Visit: Payer: Self-pay | Admitting: Cardiovascular Disease

## 2020-04-06 ENCOUNTER — Telehealth: Payer: Self-pay | Admitting: Cardiovascular Disease

## 2020-04-06 DIAGNOSIS — I2510 Atherosclerotic heart disease of native coronary artery without angina pectoris: Secondary | ICD-10-CM

## 2020-04-06 NOTE — Telephone Encounter (Signed)
    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 Burt Knack can order for her

## 2020-04-06 NOTE — Telephone Encounter (Signed)
Patient calling back.   °

## 2020-04-06 NOTE — Telephone Encounter (Signed)
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 She will call the nurse and confirm what exactly needs to be done (type of stress test, if letter needs to be written) and call back.

## 2020-04-06 NOTE — Telephone Encounter (Signed)
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 She understands she will need to have Covid test prior to stress test. She understands she will be called to schedule both once order is received.

## 2020-04-09 NOTE — Telephone Encounter (Signed)
Yes, it is ok to order the ETT. Thanks, Richardson Dopp, PA-C    04/09/2020 4:07 PM

## 2020-04-10 NOTE — Telephone Encounter (Signed)
Stress test ordered for scheduling.

## 2020-04-23 ENCOUNTER — Telehealth: Payer: Self-pay | Admitting: Physician Assistant

## 2020-04-23 NOTE — Telephone Encounter (Signed)
Pt called and stated that she has to get tested for covid on Friday, 04/27/20. Stated that she is having a procedure done that Wednesday. Wants to know if she have to quarentine during that stretch of time. Please call

## 2020-04-23 NOTE — Telephone Encounter (Signed)
Per DPR form, left detailed message for patient and reiterated that she will have to quarantine between Covid test and GXT to limit exposure risk after screening. Instructed her to call back with further questions or concerns.

## 2020-04-24 ENCOUNTER — Telehealth: Payer: Self-pay | Admitting: Cardiovascular Disease

## 2020-04-24 NOTE — Telephone Encounter (Signed)
Reiterated to patient again she will need to quarantine between covid test and GXT. She was grateful for call.

## 2020-04-24 NOTE — Telephone Encounter (Signed)
Folow Up:      Pt is returning  Katie's call from yesterday.

## 2020-04-27 ENCOUNTER — Other Ambulatory Visit (HOSPITAL_COMMUNITY): Payer: BC Managed Care – PPO

## 2020-05-14 ENCOUNTER — Other Ambulatory Visit (HOSPITAL_COMMUNITY): Payer: BC Managed Care – PPO

## 2020-05-17 ENCOUNTER — Ambulatory Visit (HOSPITAL_COMMUNITY)
Admission: RE | Admit: 2020-05-17 | Payer: BC Managed Care – PPO | Source: Ambulatory Visit | Attending: Physician Assistant | Admitting: Physician Assistant

## 2020-05-25 ENCOUNTER — Other Ambulatory Visit (HOSPITAL_COMMUNITY)
Admission: RE | Admit: 2020-05-25 | Discharge: 2020-05-25 | Disposition: A | Discharge status: Home / Self Care | Payer: BC Managed Care – PPO | Source: Ambulatory Visit | Attending: Physician Assistant | Admitting: Physician Assistant

## 2020-05-25 DIAGNOSIS — Z20822 Contact with and (suspected) exposure to covid-19: Secondary | ICD-10-CM | POA: Insufficient documentation

## 2020-05-25 DIAGNOSIS — Z01812 Encounter for preprocedural laboratory examination: Secondary | ICD-10-CM | POA: Diagnosis not present

## 2020-05-25 LAB — SARS CORONAVIRUS 2 (TAT 6-24 HRS): SARS Coronavirus 2: NEGATIVE

## 2020-05-29 ENCOUNTER — Other Ambulatory Visit: Payer: Self-pay

## 2020-05-29 ENCOUNTER — Other Ambulatory Visit: Payer: BC Managed Care – PPO | Admitting: *Deleted

## 2020-05-29 ENCOUNTER — Ambulatory Visit (INDEPENDENT_AMBULATORY_CARE_PROVIDER_SITE_OTHER): Payer: BC Managed Care – PPO

## 2020-05-29 ENCOUNTER — Encounter: Payer: Self-pay | Admitting: Radiology

## 2020-05-29 DIAGNOSIS — E785 Hyperlipidemia, unspecified: Secondary | ICD-10-CM

## 2020-05-29 DIAGNOSIS — I2510 Atherosclerotic heart disease of native coronary artery without angina pectoris: Secondary | ICD-10-CM

## 2020-05-29 DIAGNOSIS — I10 Essential (primary) hypertension: Secondary | ICD-10-CM

## 2020-05-29 LAB — LIPID PANEL
Chol/HDL Ratio: 5.1 ratio — ABNORMAL HIGH (ref 0.0–4.4)
Cholesterol, Total: 179 mg/dL (ref 100–199)
HDL: 35 mg/dL — ABNORMAL LOW (ref >39)
LDL Chol Calc (NIH): 91 mg/dL (ref 0–99)
Triglycerides: 319 mg/dL — ABNORMAL HIGH (ref 0–149)
VLDL Cholesterol Cal: 53 mg/dL — ABNORMAL HIGH (ref 5–40)

## 2020-05-29 LAB — EXERCISE TOLERANCE TEST
Estimated workload: 8 METS
Exercise duration (min): 6 min
Exercise duration (sec): 22 s
MPHR: 175 {beats}/min
Peak HR: 160 {beats}/min
Percent HR: 92 %
RPE: 16
Rest HR: 85 {beats}/min

## 2020-05-29 LAB — HEPATIC FUNCTION PANEL
ALT: 37 IU/L — ABNORMAL HIGH (ref 0–32)
AST: 29 IU/L (ref 0–40)
Albumin: 4.6 g/dL (ref 3.8–4.8)
Alkaline Phosphatase: 68 IU/L (ref 44–121)
Bilirubin Total: 0.2 mg/dL (ref 0.0–1.2)
Bilirubin, Direct: <0.1 mg/dL (ref 0.00–0.40)
Total Protein: 6.7 g/dL (ref 6.0–8.5)

## 2020-05-30 ENCOUNTER — Encounter: Payer: Self-pay | Admitting: Physician Assistant

## 2020-06-01 ENCOUNTER — Other Ambulatory Visit: Payer: Self-pay

## 2020-06-01 DIAGNOSIS — I2510 Atherosclerotic heart disease of native coronary artery without angina pectoris: Secondary | ICD-10-CM

## 2020-06-01 DIAGNOSIS — E785 Hyperlipidemia, unspecified: Secondary | ICD-10-CM

## 2020-06-01 MED ORDER — ROSUVASTATIN CALCIUM 40 MG PO TABS: 40.0000 mg | ORAL_TABLET | Freq: Every day | ORAL | 2 refills | Status: DC

## 2020-06-01 NOTE — Progress Notes (Signed)
The patient has been notified of the result and verbalized understanding.  All questions (if any) were answered. Mady Haagensen, [place] 06/01/2020 9:36 AM

## 2020-06-08 ENCOUNTER — Other Ambulatory Visit: Payer: BC Managed Care – PPO

## 2020-06-22 ENCOUNTER — Telehealth: Payer: Self-pay | Admitting: Physician Assistant

## 2020-06-22 NOTE — Telephone Encounter (Signed)
Those symptoms are likely coming from something else other than rosuvastatin.  She can certainly stop taking rosuvastatin for a week to see if it helps.  However, I do think she should see primary care today to have those symptoms further evaluated.  If symptoms are greatly improved/resolved after stopping rosuvastatin, we will need to replace it with a different medication.  She should let us know if stopping rosuvastatin improved symptoms.  If stopping rosuvastatin makes no difference, she should resume it.  Richardson Dopp, PA-C 06/22/2020 12:43 PM

## 2020-06-22 NOTE — Telephone Encounter (Signed)
I called and spoke with patient, she is aware to stop Rosuvastatin and call back in 1 week with an update on symptoms. Patient states that she has already called her primary care doctor about symptoms and she will be seeing PCP today. Patient verbalized understanding and thanked me for the call.

## 2020-06-22 NOTE — Telephone Encounter (Signed)
Pt c/o medication issue:  1. Name of Medication: Rosuvastatin  2. How are you currently taking this medication (dosage and times per day)?  1 time at night  3. Are you having a reaction (difficulty breathing--STAT)? no  4. What is your medication issue? Itching and had a rash, headache and sore throat and cough

## 2020-07-05 NOTE — Telephone Encounter (Signed)
Patient is calling to follow up. She states since discontinuing the medication, she is feeling much better. The rash and itchiness are relieved.

## 2020-07-06 NOTE — Telephone Encounter (Signed)
She was previously on Atorvastatin and was tolerating it. Her LDL was not quite to goal with Atorvastatin 20 mg.  Therefore, we will need to add an additional agent to Atorvastatin to make sure her lipids are controlled.   PLAN:  DC Rosuvastatin. Restart Atorvastatin 20 mg once daily. Start Ezetimibe 10 mg once daily. Fasting lipids and LFTs in 3 mos.  Richardson Dopp, PA-C    07/06/2020 8:41 AM

## 2020-07-10 MED ORDER — ATORVASTATIN CALCIUM 20 MG PO TABS: 20.0000 mg | ORAL_TABLET | Freq: Every day | ORAL | 3 refills | Status: DC

## 2020-07-10 MED ORDER — EZETIMIBE 10 MG PO TABS: 10.0000 mg | ORAL_TABLET | Freq: Every day | ORAL | 3 refills | Status: DC

## 2020-07-10 NOTE — Addendum Note (Signed)
Addended by: Mady Haagensen on: 07/10/2020 09:53 AM   Modules accepted: Orders

## 2020-07-10 NOTE — Telephone Encounter (Signed)
I called and spoke with patient, she is aware to resume Atorvastatin 20 mg, 1 tablet by mouth once a day and to start Zetia 10 mg, 1 tablet by mouth once a day. Patient states that she has Atorvastatin and does not need this filled currently. Atorvastatin added back to medication list and prescription for Zetia sent to patients pharmacy. Repeat labs scheduled for 10/08/20.

## 2020-07-18 DIAGNOSIS — D84821 Immunodeficiency due to drugs: Secondary | ICD-10-CM | POA: Insufficient documentation

## 2020-07-18 DIAGNOSIS — Z79899 Other long term (current) drug therapy: Secondary | ICD-10-CM | POA: Insufficient documentation

## 2020-07-30 ENCOUNTER — Other Ambulatory Visit: Payer: BC Managed Care – PPO

## 2020-08-28 ENCOUNTER — Other Ambulatory Visit: Payer: Self-pay | Admitting: Physician Assistant

## 2020-08-28 NOTE — Telephone Encounter (Signed)
Pt's medication was sent to pt's pharmacy as requested. Confirmation received.  °

## 2020-09-28 ENCOUNTER — Other Ambulatory Visit: Payer: BC Managed Care – PPO

## 2020-09-28 ENCOUNTER — Ambulatory Visit: Payer: Self-pay | Admitting: Cardiovascular Disease

## 2020-09-28 ENCOUNTER — Telehealth: Payer: Self-pay | Admitting: Physician Assistant

## 2020-09-28 DIAGNOSIS — Z79899 Other long term (current) drug therapy: Secondary | ICD-10-CM

## 2020-09-28 DIAGNOSIS — I10 Essential (primary) hypertension: Secondary | ICD-10-CM

## 2020-09-28 DIAGNOSIS — I2510 Atherosclerotic heart disease of native coronary artery without angina pectoris: Secondary | ICD-10-CM

## 2020-09-28 DIAGNOSIS — E785 Hyperlipidemia, unspecified: Secondary | ICD-10-CM

## 2020-09-28 NOTE — Telephone Encounter (Signed)
Agree with holding the meds and getting labs. I can see her this afternoon if you want. Gerald Stabs

## 2020-09-28 NOTE — Telephone Encounter (Signed)
Patient calling in because she thinks she is having a medication reaction. She started taking Ezetimibe and Atorvastatin about two months ago. Patient had a reaction to Rosuvastatin in the past. She is now experieincing swelling in the left side of her face. Denies any difficulty swallowing. States her urine is dark, experiencing indigestion and is extremely tired.   Will route to PharmD for advisement.

## 2020-09-28 NOTE — Telephone Encounter (Signed)
She should stop taking rosuvastatin and ezetimbie. She needs to come in for a stat CK and BMET. Sending to DOD to make sure I didn't miss anything

## 2020-09-28 NOTE — Telephone Encounter (Signed)
Pt c/o medication issue:  1. Name of Medication:Ezetimibe  2. How are you currently taking this medication (dosage and times per day)?  1 time a day  3. Are you having a reaction (difficulty breathing--STAT)? No  4. What is your medication issue? Left side of her face is swollen, change in color of her bowel movement, urine is dark, indigestion  and real tired

## 2020-09-28 NOTE — Telephone Encounter (Signed)
I have reviewed this with our PharmD. Given the facial swelling and dark urine, she will need to go to ED for further evaluation where we can get STAT labs. Hold medications as well. Gerald Stabs

## 2020-09-28 NOTE — Telephone Encounter (Signed)
Spoke with the patient. She has been advised to go to the ED for further evaluation and STAT labs.  She states she is fine to drive.  Aware to hold atorvastatin and zetia for now.

## 2020-10-01 NOTE — Telephone Encounter (Signed)
It looks like she never went to the ED.  Please call and check on her today. Richardson Dopp, PA-C    10/01/2020 7:39 AM

## 2020-10-01 NOTE — Telephone Encounter (Signed)
Spoke with pt and advised per Richardson Dopp PA-C she will need labs, CMET and total CK.  Pt is scheduled for Lipids and LFT's on 10/08/2020.  Pt agreeable to changing lab appointment to 10/02/2020.  Orders placed and appointment scheduled.  Pt thanked Therapist, sports for phone call.

## 2020-10-01 NOTE — Addendum Note (Signed)
Addended by: Thora Lance on: 10/01/2020 12:17 PM   Modules accepted: Orders

## 2020-10-01 NOTE — Telephone Encounter (Signed)
Spoke with pt who reports she stopped the Zetia but continued Atorvastatin.  Facial swelling has resolved as well asher urine has returned to normal light yellow color.  Pt states she is feeling fine now without any additional complaints and thanked RN for the call.

## 2020-10-01 NOTE — Telephone Encounter (Signed)
She should come in for a CMET, total CK as previously recommended. Richardson Dopp, PA-C    10/01/2020 11:59 AM

## 2020-10-02 ENCOUNTER — Other Ambulatory Visit: Payer: Self-pay

## 2020-10-02 ENCOUNTER — Other Ambulatory Visit: Payer: BC Managed Care – PPO

## 2020-10-02 DIAGNOSIS — I2510 Atherosclerotic heart disease of native coronary artery without angina pectoris: Secondary | ICD-10-CM

## 2020-10-02 DIAGNOSIS — I10 Essential (primary) hypertension: Secondary | ICD-10-CM

## 2020-10-02 DIAGNOSIS — Z79899 Other long term (current) drug therapy: Secondary | ICD-10-CM

## 2020-10-02 DIAGNOSIS — E785 Hyperlipidemia, unspecified: Secondary | ICD-10-CM

## 2020-10-03 ENCOUNTER — Telehealth: Payer: Self-pay | Admitting: *Deleted

## 2020-10-03 ENCOUNTER — Other Ambulatory Visit: Payer: Self-pay | Admitting: *Deleted

## 2020-10-03 DIAGNOSIS — E785 Hyperlipidemia, unspecified: Secondary | ICD-10-CM

## 2020-10-03 LAB — LIPID PANEL
Chol/HDL Ratio: 4.5 ratio — ABNORMAL HIGH (ref 0.0–4.4)
Cholesterol, Total: 157 mg/dL (ref 100–199)
HDL: 35 mg/dL — ABNORMAL LOW (ref >39)
LDL Chol Calc (NIH): 73 mg/dL (ref 0–99)
Triglycerides: 304 mg/dL — ABNORMAL HIGH (ref 0–149)
VLDL Cholesterol Cal: 49 mg/dL — ABNORMAL HIGH (ref 5–40)

## 2020-10-03 LAB — COMPREHENSIVE METABOLIC PANEL
ALT: 30 IU/L (ref 0–32)
AST: 25 IU/L (ref 0–40)
Albumin/Globulin Ratio: 1.7 (ref 1.2–2.2)
Albumin: 4.5 g/dL (ref 3.8–4.8)
Alkaline Phosphatase: 82 IU/L (ref 44–121)
BUN/Creatinine Ratio: 12 (ref 9–23)
BUN: 11 mg/dL (ref 6–24)
Bilirubin Total: 0.2 mg/dL (ref 0.0–1.2)
CO2: 20 mmol/L (ref 20–29)
Calcium: 10 mg/dL (ref 8.7–10.2)
Chloride: 98 mmol/L (ref 96–106)
Creatinine, Ser: 0.89 mg/dL (ref 0.57–1.00)
Globulin, Total: 2.7 g/dL (ref 1.5–4.5)
Glucose: 161 mg/dL — ABNORMAL HIGH (ref 65–99)
Potassium: 4.3 mmol/L (ref 3.5–5.2)
Sodium: 138 mmol/L (ref 134–144)
Total Protein: 7.2 g/dL (ref 6.0–8.5)
eGFR: 81 mL/min/{1.73_m2} (ref >59)

## 2020-10-03 LAB — CK TOTAL AND CKMB (NOT AT ARMC)
CK-MB Index: 2.1 ng/mL (ref 0.0–5.3)
Total CK: 143 U/L (ref 32–182)

## 2020-10-03 LAB — HEPATIC FUNCTION PANEL: Bilirubin, Direct: <0.1 mg/dL (ref 0.00–0.40)

## 2020-10-03 NOTE — Telephone Encounter (Signed)
-----   Message from Liliane Shi, [place] sent at 10/03/2020  8:21 AM EST ----- Bilirubin, creatinine, K+, LFTs, CK normal.  Triglycerides and glucose elevated.  LDL above goal. He was told to stop atorvastatin and ezetimibe due to an allergic reaction. PLAN:  -Please refer to lipid clinic at [facility] for management of hyperlipidemia Richardson Dopp, PA-C    10/03/2020 8:18 AM

## 2020-10-08 ENCOUNTER — Other Ambulatory Visit: Payer: BC Managed Care – PPO

## 2020-10-16 ENCOUNTER — Ambulatory Visit: Payer: BC Managed Care – PPO

## 2020-10-29 NOTE — Progress Notes (Signed)
Patient ID: [name]                 DOB: May 22, 1976                    MRN: [id number]     HPI: [name] is a 45 y.o. female patient referred to lipid clinic by Dr. Burt Knack. PMH is significant for T2DM, CAD S/p NSTEMI in 2009 tx with DES to RCA and OM1, HTN, HLD, GERD, Crohn's and obesity.  On 09/28/20, pt reported left side of face was swollen, change in color of her bowel movement, dark urine, indigestion, and fatigue with ezetimibe 10 mg daily which was started 07/10/20. Patient was instructed to hold both atorvastatin and ezetimibe. CK was normal. Symptoms resolved upon discontinuing ezetimibe and patient restarted atorvastatin 20 mg daily.  Patient presents today in good spirits. Reports tolerating atorvastatin 20 mg daily with no complaints. Reports symptoms completely resolved after discontinuing ezetimibe.  Current Medications: atorvastatin 20 mg daily  Intolerances: rosuvastatin 40 mg daily (itching, hives/rash), simvastatin 20 mg & 40 mg daily (muscle aches), ezetimibe 10 mg daily (swollen face, dark urine and bowels, indigestion, fatigue)  Risk Factors: hx of MI at age 79, HTN, CAD, T2DM LDL goal: <55 mg/dL  Diet: alcohol intake occasionally, drinks soda, eats pasta  Family History: Heart attack in her maternal grandfather; Hypertension in her mother; Stroke in her maternal grandmother.  Social History: former smoker (0.33 ppd x14 years)  Labs: 10/02/20   LDL 73, TG 304, TC 157, HDL 35 (atorvastatin 20 mg daily and ezetimibe 10 mg daily)  LFTs: AST 25, ALT 30, TP 7.2, Tbili 0.2, Dbili <0.10  CK Total 143; CK MB 2.1 (wnl)  Past Medical History:  Diagnosis Date  . Coronary artery disease    ETT 10/21: Fair exercise capacity, achieved 8 METS, hypertensive blood pressure response, no ischemic EKG changes  . GERD (gastroesophageal reflux disease)   . Hyperlipidemia LDL goal <70   . Hypertension   . MI (myocardial infarction) [facility])    Nov 2010  . Obesity      Current Outpatient Medications on File Prior to Visit  Medication Sig Dispense Refill  . acetaminophen (TYLENOL) 325 MG tablet Take 650 mg by mouth daily as needed for headache (pain).    Marland Kitchen aspirin EC 81 MG tablet Take 1 tablet (81 mg total) by mouth daily.    . Dapagliflozin-metFORMIN HCl ER (XIGDUO XR) 12-998 MG TB24 Take 2 tablets by mouth daily.    Marland Kitchen esomeprazole (NEXIUM) 40 MG capsule Take 1 capsule (40 mg total) by mouth 2 (two) times daily before a meal. 60 capsule 0  . inFLIXimab (REMICADE) 100 MG injection Inject 100 mg into the vein every 8 (eight) weeks.    Marland Kitchen lisinopril-hydrochlorothiazide (ZESTORETIC) 20-12.5 MG tablet Take 2 tablets by mouth daily. Please make overdue appt with Dr. Burt Knack before anymore refills. 1st attempt 60 tablet 0  . metoprolol tartrate (LOPRESSOR) 50 MG tablet Take 1 tablet by mouth twice daily 180 tablet 3  . Multiple Vitamin (MULTIVITAMIN WITH MINERALS) TABS tablet Take 1 tablet by mouth daily.    . potassium chloride (KLOR-CON) 10 MEQ tablet Take 10 mEq by mouth daily.      No current facility-administered medications on file prior to visit.    Allergies  Allergen Reactions  . Hydrocodone-Acetaminophen Itching    Generic that is white with red speckles caused itching    Assessment/Plan:  1. Hyperlipidemia -  LDL above goal <55 mg/dL given hx of MI at age 40, HTN, CAD, and T2DM. Medication adherence appears optimal with atorvastatin 20 mg daily. Discussed clinical benefits of PCSK9-inhibitors and demonstrated proper administration technique with teach-back method. Will start Praluent 75 mg every 2 weeks pending PA approval. Continued atorvastatin 20 mg daily. Encouraged patient to aim for a diet full of vegetables, fruit and lean meats (chicken, [place], fish) and to limit carbs (bread, pasta, sugar, rice) and red meat consumption. Encouraged patient to exercise 20-30 minutes daily with the goal of 150 minutes per week. Patient verbalized  understanding. Will schedule fasting lipid panel, direct LDL and LFTs in 3 months.   2. Hypertriglyceridemia - TG above goal <150 mg/dL. Medication adherence appears optimal with atorvastatin 20 mg daily. Discussed clinical benefits of Vascepa. Will start Vascepa 2 g BID pending PA approval. Encouraged patient to minimize excessive carbs, sugar and alcohol intake. Patient verbalized understanding. Will schedule fasting lipid panel, direct LDL and LFTs in 3 months.  Lorel Monaco, PharmD, [place] [address], [place], [place] [address] Phone: [phone]; Fax: [phone]

## 2020-10-30 ENCOUNTER — Telehealth: Payer: Self-pay | Admitting: Pharmacist

## 2020-10-30 ENCOUNTER — Ambulatory Visit (INDEPENDENT_AMBULATORY_CARE_PROVIDER_SITE_OTHER): Payer: BC Managed Care – PPO | Admitting: Pharmacist

## 2020-10-30 ENCOUNTER — Other Ambulatory Visit: Payer: Self-pay

## 2020-10-30 DIAGNOSIS — E785 Hyperlipidemia, unspecified: Secondary | ICD-10-CM

## 2020-10-30 MED ORDER — ICOSAPENT ETHYL 1 G PO CAPS: 2.0000 g | ORAL_CAPSULE | Freq: Two times a day (BID) | ORAL | 11 refills | Status: DC

## 2020-10-30 MED ORDER — ICOSAPENT ETHYL 1 G PO CAPS
2.0000 g | ORAL_CAPSULE | Freq: Two times a day (BID) | ORAL | 3 refills | Status: DC
Start: 2020-10-30 — End: 2021-11-15

## 2020-10-30 MED ORDER — PRALUENT 75 MG/ML ~~LOC~~ SOAJ: 1.0000 "pen " | SUBCUTANEOUS | 11 refills | Status: DC

## 2020-10-30 NOTE — Patient Instructions (Addendum)
Nice to see you today!  Keep up the good work with diet and exercise. Aim for a diet full of vegetables, fruit and lean meats (chicken, [place], fish). Try to limit carbs (bread, pasta, sugar, rice) and red meat consumption.  Your goal LDL is <55 mg/dL, you're currently at 73 mg/dL  Medication Changes:  Will start either Repatha or Praluent every 2 weeks. Will give you call later today or tomorrow to schedule follow-up labs in 3 months.   Begin taking Vascepa 2 g twice daily  Continue atorvastatin 20 mg daily  Please give [place] a call at [phone] with any questions or concerns.   For PCSK9i, inject once every other week (any day of the week that works for you) into the fatty skin of stomach, upper outer thigh or back of the arm. Clean the site with soap and warm water or an alcohol pad. Keep the medication in the fridge until you are ready to give your dose, then take it out and let warm up to room temperature for 30-60 mins.

## 2020-10-30 NOTE — Telephone Encounter (Addendum)
Contacted patient regarding dyslipidemia management. PA approved and prescription sent to pharmacy for Praluent 75 mg every 2 weeks and Vascepa 2 g BID. Faxed copay card information to pharmacy. Scheduled fasting lipid panel, direct LDL, and LFTs in 3 months. Patient verbalized understanding.   FYI: Copay Card information Vascepa: BIN# [id number], PCN# [id number], GRP# [id number], ID# [id number]  Praluent: BIN# [id number], PCN# [id number], GRP# [id number], ID# [id number]

## 2020-11-02 ENCOUNTER — Telehealth: Payer: Self-pay | Admitting: *Deleted

## 2020-11-02 NOTE — Telephone Encounter (Signed)
Patient called and stated that she would like to change the injectable that was prescribed to the one that she was told by the pharmacist here would be $5 but she is unsure of the name of it. Patient would like for someone to call her to discuss at [phone]. Thanks, MI

## 2020-11-05 ENCOUNTER — Encounter: Payer: Self-pay | Admitting: Pharmacist

## 2020-11-05 NOTE — Telephone Encounter (Signed)
Patient called.  Requested a PA for Repatha because copay would be 5 dollars as opposed to 25 dollars for Praluent.  Explained to patient that her insurance plan may prefer Praluent but I would be happy to submit PA for patient.

## 2020-11-05 NOTE — Telephone Encounter (Signed)
Plan prefers Praluent.  Called patient and let her know.

## 2020-12-12 ENCOUNTER — Telehealth: Payer: Self-pay | Admitting: Cardiovascular Disease

## 2020-12-12 NOTE — Telephone Encounter (Signed)
Called patient and reviewed advice from PharmD. She verbalized understanding and thanked me for the call.

## 2020-12-12 NOTE — Telephone Encounter (Signed)
Pt c/o medication issue:  1. Name of Medication: Alirocumab ([place]) 75 MG/ML SOAJ  2. How are you currently taking this medication (dosage and times per day)? Every 14 days  3. Are you having a reaction (difficulty breathing--STAT)? no  4. What is your medication issue? Patient states she picked up the medication on the 6th, but forgot to put it in the fridge. She states she was supposed to take it Monday, but hasn't and is not sure what to do.

## 2020-12-12 NOTE — Telephone Encounter (Signed)
Medication can be kept out of the fridge for 30 days so she should be fine.  She can inject medication today

## 2021-01-21 ENCOUNTER — Other Ambulatory Visit: Payer: BC Managed Care – PPO

## 2021-01-23 ENCOUNTER — Other Ambulatory Visit: Payer: BC Managed Care – PPO

## 2021-02-11 ENCOUNTER — Emergency Department (HOSPITAL_BASED_OUTPATIENT_CLINIC_OR_DEPARTMENT_OTHER)
Admission: EM | Admit: 2021-02-11 | Discharge: 2021-02-11 | Disposition: A | Discharge status: Home / Self Care | Payer: BC Managed Care – PPO | Attending: Emergency Medicine | Admitting: Emergency Medicine

## 2021-02-11 ENCOUNTER — Encounter (HOSPITAL_BASED_OUTPATIENT_CLINIC_OR_DEPARTMENT_OTHER): Payer: Self-pay | Admitting: Emergency Medicine

## 2021-02-11 ENCOUNTER — Other Ambulatory Visit: Payer: Self-pay

## 2021-02-11 ENCOUNTER — Emergency Department (HOSPITAL_BASED_OUTPATIENT_CLINIC_OR_DEPARTMENT_OTHER): Payer: BC Managed Care – PPO

## 2021-02-11 DIAGNOSIS — Z87891 Personal history of nicotine dependence: Secondary | ICD-10-CM | POA: Insufficient documentation

## 2021-02-11 DIAGNOSIS — Z79899 Other long term (current) drug therapy: Secondary | ICD-10-CM | POA: Insufficient documentation

## 2021-02-11 DIAGNOSIS — I2510 Atherosclerotic heart disease of native coronary artery without angina pectoris: Secondary | ICD-10-CM | POA: Insufficient documentation

## 2021-02-11 DIAGNOSIS — I10 Essential (primary) hypertension: Secondary | ICD-10-CM | POA: Diagnosis not present

## 2021-02-11 DIAGNOSIS — Z7982 Long term (current) use of aspirin: Secondary | ICD-10-CM | POA: Insufficient documentation

## 2021-02-11 DIAGNOSIS — E119 Type 2 diabetes mellitus without complications: Secondary | ICD-10-CM | POA: Diagnosis not present

## 2021-02-11 DIAGNOSIS — M79671 Pain in right foot: Secondary | ICD-10-CM | POA: Diagnosis present

## 2021-02-11 DIAGNOSIS — M7731 Calcaneal spur, right foot: Secondary | ICD-10-CM | POA: Insufficient documentation

## 2021-02-11 NOTE — ED Provider Notes (Signed)
[facility] EMERGENCY DEPARTMENT Provider Note   CSN: [id number] Arrival date & time: 02/11/21  1059     History Chief Complaint  Patient presents with   Foot Pain    [name] is a 45 y.o. female who presents to the ED today with complaint of gradual onset, constant, sharp, R foot/heel pain that began 2 days ago.  She reports she woke up 2 days ago with pain and specifically to her heel/distal calf area along her Achilles tendon.  She states that she had difficulty walking on her foot.  She states that she applied ice as well as heat/Epsom salts with mild relief of her pain however again this morning woke up with inability to bear weight onto her foot due to pain prompting ED visit today.  She denies any injury to the foot/ankle.  She denies any increase in activity recently.  No other complaints at this time.   The history is provided by the patient and medical records.      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   Ventral hernia 12/15/2018   Crohn's disease with complication ([place]) 11/57/2620   Crohn's disease of both small and large intestine without complication ([place]) 35/59/7416   Type 2 diabetes mellitus with hyperlipidemia ([place]) 09/25/2017   Coronary artery disease 09/10/2016   Essential hypertension 09/10/2016   Hyperlipidemia LDL goal <70 09/10/2016   History of MI (myocardial infarction) 09/10/2016   TOBACCO ABUSE 04/06/2009   Severe obesity (BMI 35.0-39.9) with comorbidity ([place]) 11/08/2008   Gastroesophageal reflux disease without esophagitis 11/08/2008     Past Surgical History:  Procedure Laterality Date   CARDIAC CATHETERIZATION     Promus drug-eluting stent to the OM 1     OB History   No obstetric history on file.     Family History  Problem Relation Age of Onset   Hypertension Mother    Stroke Maternal Grandmother    Heart attack Maternal Grandfather     Social History   Tobacco Use   Smoking status: Former    Pack years: 0.00   Smokeless tobacco: Never   Tobacco comments:    10 packs/year  Substance Use Topics   Alcohol use: Yes    Comment: rarely    Drug use: No    Home Medications Prior to Admission medications   Medication Sig Start Date End Date Taking? Authorizing Provider  acetaminophen (TYLENOL) 325 MG tablet Take 650 mg by mouth daily as needed for headache (pain).    [provider]  Alirocumab (PRALUENT) 75 MG/ML SOAJ Inject 1 pen into the skin every 14 (fourteen) days. 10/30/20   Sherren Mocha, MD  aspirin EC 81 MG tablet Take 1 tablet (81 mg total) by mouth daily. 01/06/11   Sherren Mocha, MD  Dapagliflozin-metFORMIN HCl ER (XIGDUO XR) 12-998 MG TB24 Take 2 tablets by mouth daily. 11/11/18   [provider]  esomeprazole (NEXIUM) 40 MG capsule Take 1 capsule (40 mg total) by mouth 2 (two) times daily before a meal. 08/24/13  Jola Schmidt, MD  icosapent Ethyl (VASCEPA) 1 g capsule Take 2 capsules (2 g total) by mouth 2 (two) times daily. 10/30/20   Sherren Mocha, MD  inFLIXimab (REMICADE) 100 MG injection Inject 100 mg into the vein every 8 (eight) weeks.    [provider]  lisinopril-hydrochlorothiazide (ZESTORETIC) 20-12.5 MG tablet Take 2 tablets by mouth daily. Please make overdue appt with Dr. Burt Knack before anymore refills. 1st attempt 01/23/20   Sherren Mocha, MD  metoprolol tartrate (LOPRESSOR) 50 MG tablet Take 1 tablet by mouth twice daily 03/23/20   Sherren Mocha, MD  Multiple Vitamin (MULTIVITAMIN WITH MINERALS) TABS tablet Take 1 tablet by mouth daily.     [provider]  potassium chloride (KLOR-CON) 10 MEQ tablet Take 10 mEq by mouth daily.  11/09/19   [provider]    Allergies    Hydrocodone-acetaminophen, Ezetimibe, Rosuvastatin, and Simvastatin  Review of Systems   Review of Systems  Constitutional:  Negative for chills and fever.  Musculoskeletal:  Positive for arthralgias.  All other systems reviewed and are negative.  Physical Exam Updated Vital Signs BP 117/80 (BP Location: Right Arm)   Pulse 80   Temp 98.2 F (36.8 C) (Oral)   Resp 16   Ht 5' 2"  (1.575 m)   Wt 88.5 kg   LMP  (LMP Unknown)   SpO2 100%   BMI 35.67 kg/m   Physical Exam Vitals and nursing note reviewed.  Constitutional:      Appearance: She is not ill-appearing or diaphoretic.  HENT:     Head: Normocephalic and atraumatic.  Eyes:     Conjunctiva/sclera: Conjunctivae normal.  Cardiovascular:     Rate and Rhythm: Normal rate and regular rhythm.     Pulses: Normal pulses.  Pulmonary:     Effort: Pulmonary effort is normal.     Breath sounds: Normal breath sounds. No wheezing, rhonchi or rales.  Musculoskeletal:     Comments: No obvious swelling, ecchymosis, or erythema noted to R heel. No TTP to the achilles tendon itself and not deformity appreciated. Negative Thompson's test. + TTP to the R heel itself. ROM intact to ankle. 2+ DP pulse.   Skin:    General: Skin is warm and dry.     Coloration: Skin is not jaundiced.  Neurological:     Mental Status: She is alert.    ED Results / Procedures / Treatments   Labs (all labs ordered are listed, but only abnormal results are displayed) Labs Reviewed - No data to display  EKG None  Radiology DG Foot Complete Right  Result Date: 02/11/2021 CLINICAL DATA:  Right heel pain EXAM: RIGHT FOOT COMPLETE - 3+ VIEW COMPARISON:  None. FINDINGS: Plantar calcaneal spur. No acute bony abnormality. Specifically, no fracture, subluxation, or dislocation. Joint spaces maintained. Soft  tissues intact. IMPRESSION: Calcaneal spur. No acute bony abnormality. Electronically Signed   By: Rolm Baptise M.D.   On: 02/11/2021 12:18    Procedures Procedures   Medications Ordered in ED Medications - No data to display  ED Course  I have reviewed the triage vital signs and the nursing notes.  Pertinent labs & imaging results that were available during my care of the patient were reviewed by me and considered in my medical decision making (see chart for details).    MDM Rules/Calculators/A&P                          45 year old female who  presents to the ED today with complaint of atraumatic right heel pain for the past 2 days.  Unable to bear weight due to pain.  No previous injury.  She states that the pain started in her Achilles tendon and is now mostly in the heel itself.  On arrival to the ED today vitals are stable.  On my exam she has no overlying skin changes to the heel/ankle itself.  She has negative Thompson test.  There is no deformity or tenderness palpation to the Achilles tendon itself.  She does have tenderness palpation to the posterior aspect of the heel.  We will plan for x-ray of same.  Question heel spur versus other abnormality.  No obvious findings to suggest gout or septic arthritis.  If x-ray normal will plan for crutches given pain with ambulation and rice therapy with PCP follow-up.   Xray positive for calcaneal spur which is likely the cause of patient's pain. Given pain with ambulation will provide crutches at this time. Will have pt follow up with podiatry. RICE therapy recommend and Ibuprofen/Tylenol for pain. Pt in agreement with plan and stable for discharge.   This note was prepared using Dragon voice recognition software and may include unintentional dictation errors due to the inherent limitations of voice recognition software.   Final Clinical Impression(s) / ED Diagnoses Final diagnoses:  Calcaneal spur of right foot    Rx / DC Orders ED  Discharge Orders     None        Discharge Instructions      Your xray showed findings consistent with a heel spur which is likely the cause of your pain at this time. Please follow up with [facility] for further evaluation.   While at home rest, ice, and elevate your foot to help with pain/swelling. Please use crutches as needed.   Take Ibuprofen and Tylenol as needed for pain.   Return to the ED for any new/worsening symptoms       Eustaquio Maize, Hershal Coria 02/11/21 1239    Hayden Rasmussen, MD 02/11/21 [phone]

## 2021-02-11 NOTE — ED Triage Notes (Signed)
Pain in right achilles on 02/09/21. Now, the pain has moved down around the heel and patient cannot put pressure on the heel when walking.

## 2021-02-11 NOTE — ED Notes (Signed)
Patient transported to X-ray 

## 2021-02-11 NOTE — Discharge Instructions (Addendum)
Your xray showed findings consistent with a heel spur which is likely the cause of your pain at this time. Please follow up with [facility] for further evaluation.   While at home rest, ice, and elevate your foot to help with pain/swelling. Please use crutches as needed.   Take Ibuprofen and Tylenol as needed for pain.   Return to the ED for any new/worsening symptoms

## 2021-02-20 ENCOUNTER — Ambulatory Visit (INDEPENDENT_AMBULATORY_CARE_PROVIDER_SITE_OTHER): Payer: BC Managed Care – PPO

## 2021-02-20 ENCOUNTER — Other Ambulatory Visit: Payer: Self-pay

## 2021-02-20 ENCOUNTER — Ambulatory Visit (INDEPENDENT_AMBULATORY_CARE_PROVIDER_SITE_OTHER): Payer: BC Managed Care – PPO | Admitting: Podiatry

## 2021-02-20 DIAGNOSIS — M722 Plantar fascial fibromatosis: Secondary | ICD-10-CM

## 2021-02-20 MED ORDER — MELOXICAM 15 MG PO TABS: 15.0000 mg | ORAL_TABLET | Freq: Every day | ORAL | 1 refills | Status: DC

## 2021-02-20 MED ORDER — METHYLPREDNISOLONE 4 MG PO TBPK: ORAL_TABLET | ORAL | 0 refills | Status: DC

## 2021-02-20 MED ORDER — BETAMETHASONE SOD PHOS & ACET 6 (3-3) MG/ML IJ SUSP
3.0000 mg | Freq: Once | INTRAMUSCULAR | Status: AC
  Administered 2021-02-20: 3 mg via INTRA_ARTICULAR

## 2021-02-20 NOTE — Progress Notes (Signed)
   Subjective: 45 y.o. female presenting as a new patient for evaluation of right heel pain is been going on for about 1-2 weeks now.  Patient went to the emergency department was diagnosed with a bone spur.  She was provided crutches and recommended follow-up here in the office.  She presents for follow-up treatment and evaluation   Past Medical History:  Diagnosis Date   Coronary artery disease    ETT 10/21: Fair exercise capacity, achieved 8 METS, hypertensive blood pressure response, no ischemic EKG changes   GERD (gastroesophageal reflux disease)    Hyperlipidemia LDL goal <70    Hypertension    MI (myocardial infarction) [facility])    Nov 2010   Obesity      Objective: Physical Exam General: The patient is alert and oriented x3 in no acute distress.  Dermatology: Skin is warm, dry and supple bilateral lower extremities. Negative for open lesions or macerations bilateral.   Vascular: Dorsalis Pedis and Posterior Tibial pulses palpable bilateral.  Capillary fill time is immediate to all digits.  Neurological: Epicritic and protective threshold intact bilateral.   Musculoskeletal: Tenderness to palpation to the plantar aspect of the right heel along the plantar fascia. All other joints range of motion within normal limits bilateral. Strength 5/5 in all groups bilateral.     Assessment: 1. Plantar fasciitis right  Plan of Care:  1. Patient evaluated. Xrays reviewed that were taken in the emergency department.   2. Injection of 0.5cc Celestone soluspan injected into the right plantar fascia  3. Rx for Medrol Dose Pack placed 4. Rx for Meloxicam ordered for patient. 5.  Cam boot dispensed.  Weightbearing as tolerated 6. Instructed patient regarding therapies and modalities at home to alleviate symptoms.  7. 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 Edrick Kins, DPM    [address], [place] [address]                Office [phone]  Fax [phone]

## 2021-03-13 ENCOUNTER — Ambulatory Visit (INDEPENDENT_AMBULATORY_CARE_PROVIDER_SITE_OTHER): Payer: BC Managed Care – PPO | Admitting: Podiatry

## 2021-03-13 ENCOUNTER — Other Ambulatory Visit: Payer: Self-pay

## 2021-03-13 DIAGNOSIS — M722 Plantar fascial fibromatosis: Secondary | ICD-10-CM

## 2021-03-13 NOTE — Progress Notes (Signed)
   Subjective: 45 y.o. female presenting as a new patient for follow-up evaluation of plantar fasciitis to the right foot.  Patient states that she feels much better.  She has no pain when ambulating.  No new complaints at this time  Past Medical History:  Diagnosis Date   Coronary artery disease    ETT 10/21: Fair exercise capacity, achieved 8 METS, hypertensive blood pressure response, no ischemic EKG changes   GERD (gastroesophageal reflux disease)    Hyperlipidemia LDL goal <70    Hypertension    MI (myocardial infarction) [place])    Nov 2010   Obesity    Objective: Physical Exam General: The patient is alert and oriented x3 in no acute distress.  Dermatology: Skin is warm, dry and supple bilateral lower extremities. Negative for open lesions or macerations bilateral.   Vascular: Dorsalis Pedis and Posterior Tibial pulses palpable bilateral.  Capillary fill time is immediate to all digits.  Neurological: Epicritic and protective threshold intact bilateral.   Musculoskeletal: Negative for any significant tenderness to palpation to the plantar aspect of the right heel along the plantar fascia. All other joints range of motion within normal limits bilateral. Strength 5/5 in all groups bilateral.     Assessment: 1. Plantar fasciitis right  Plan of Care:  1. Patient evaluated.  2.  Overall the patient is much better and has no pain to the heel 3.  Continue meloxicam as needed 4.  Continue wearing good supportive shoes and sneakers 5.  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 Edrick Kins, DPM    [address], [place] [address]                Office [phone]  Fax [phone]

## 2021-04-14 ENCOUNTER — Other Ambulatory Visit: Payer: Self-pay | Admitting: Cardiovascular Disease

## 2021-05-21 ENCOUNTER — Other Ambulatory Visit: Payer: Self-pay | Admitting: Cardiovascular Disease

## 2021-05-28 ENCOUNTER — Other Ambulatory Visit: Payer: Self-pay | Admitting: Podiatry

## 2021-05-28 ENCOUNTER — Telehealth: Payer: Self-pay | Admitting: Podiatry

## 2021-05-28 MED ORDER — METHYLPREDNISOLONE 4 MG PO TBPK: ORAL_TABLET | ORAL | 0 refills | Status: DC

## 2021-05-28 NOTE — Telephone Encounter (Signed)
Patient is in severe pain-right heel. She would like a refill on prednisone.

## 2021-05-28 NOTE — Telephone Encounter (Signed)
Sent!

## 2021-06-03 ENCOUNTER — Telehealth: Payer: Self-pay | Admitting: Cardiovascular Disease

## 2021-06-03 MED ORDER — METOPROLOL TARTRATE 50 MG PO TABS
ORAL_TABLET | ORAL | 0 refills | Status: DC
Start: 2021-06-03 — End: 2021-07-15

## 2021-06-03 NOTE — Telephone Encounter (Signed)
*  STAT* If patient is at the pharmacy, call can be transferred to refill team.   1. Which medications need to be refilled? (please list name of each medication and dose if known) metoprolol tartrate (LOPRESSOR) 50 MG tablet  2. Which pharmacy/location (including street and city if local pharmacy) is medication to be sent to? [facility], [address]  3. Do they need a 30 day or 90 day supply? [place]

## 2021-06-03 NOTE — Telephone Encounter (Signed)
Pt scheduled to see Richardson Dopp, PA-C, 08/26/21.. Will send in 90 day refill, until appointment.

## 2021-07-09 ENCOUNTER — Telehealth: Payer: Self-pay | Admitting: Cardiovascular Disease

## 2021-07-09 NOTE — Telephone Encounter (Signed)
Pt c/o medication issue:  1. Name of Medication:  Alirocumab ([place]) 75 MG/ML SOAJ  2. How are you currently taking this medication (dosage and times per day)? N/A  3. Are you having a reaction (difficulty breathing--STAT)? No   4. What is your medication issue? [place] states she cannot afford this medications.

## 2021-07-09 NOTE — Telephone Encounter (Signed)
Will route this message to our PharmD team and Prior Auth Nurse to further assist this pt.

## 2021-07-09 NOTE — Telephone Encounter (Signed)
Called patient. She states that she was talking to the pharmacist who said there was a medication that was $5 but her insurance would pay for it. I explained that it was only $5 bc that's what their copay card was. Patient is using copay card for praluent paying $25. She pays $30 for vascepa. I advised that I would get her a copay card for vascepa so that the cost was $9. Pt appreciative of the help. Copay card called into pharmacy.  Vascepa copay card Loop BIN# [id number] PCN# [id number] GRP# [id number] ID [id number]

## 2021-07-13 ENCOUNTER — Other Ambulatory Visit: Payer: Self-pay | Admitting: Cardiovascular Disease

## 2021-08-01 ENCOUNTER — Other Ambulatory Visit: Payer: Self-pay | Admitting: Cardiovascular Disease

## 2021-08-19 NOTE — Progress Notes (Signed)
Cardiology Office Note:    Date:  08/20/2021   ID:  [name], DOB 06/17/1976, MRN [id number]  PCP:  Medicine, [place] Providers Cardiologist:  Sherren Mocha, MD  Referring MD: Medicine, [facility]*   Chief Complaint: Overdue one-year f/u of  CAD, HTN  History of Present Illness:    [name] is a 46 y.o. female with a hx of CAD s/p DES to RCA and OM1, MI, HTN, T2DM, tobacco abuse, and obesity.  She has a history of NSTEMI in 2009 with DES to RCA and DES x 2 to OM1. She had a negative myoview in 2013. Her ETT on 10/21 revealed significantly high BP during exercise, otherwise normal.   She was last seen in our office on 03/06/20 by Richardson Dopp, PA. Her BP was well-controlled at that visit and she was advised to follow-up in 1 year.  Today, she is here alone for follow-up of coronary artery disease and hypertension.  She reports she has been feeling well and has had no chest pain, shortness of breath, lower extremity edema, fatigue, palpitations, melena, hematuria, hemoptysis, diaphoresis, weakness, presyncope, syncope, orthopnea, or PND.  She admits she has been less active recently since the weather has been cold but when weather is warm she walks outside.  She admits to eating a diet high in rice, pasta, and processed foods. 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 She previously had uncomfortable ankle edema with amlodipine. Denies bleeding concerns. Has routine colonoscopy for Crohn's disease with next one planned for 5 years. She denies specific cardiac concerns today.    Past Medical History:  Diagnosis Date   Coronary artery disease    ETT 10/21: Fair exercise capacity, achieved 8 METS, hypertensive blood pressure response, no ischemic EKG changes   GERD (gastroesophageal reflux disease)    Hyperlipidemia LDL goal <70    Hypertension    MI (myocardial infarction) [facility])    Nov 2010    Obesity     Past Surgical History:  Procedure Laterality Date   CARDIAC CATHETERIZATION     Promus drug-eluting stent to the OM 1    Current Medications: Current Meds  Medication Sig   acetaminophen (TYLENOL) 325 MG tablet Take 650 mg by mouth daily as needed for headache (pain).   Alirocumab (PRALUENT) 75 MG/ML SOAJ Inject 1 pen into the skin every 14 (fourteen) days.   aspirin EC 81 MG tablet Take 1 tablet (81 mg total) by mouth daily.   atorvastatin (LIPITOR) 20 MG tablet Take 1 tablet by mouth once daily   BINAXNOW COVID-19 AG HOME TEST KIT Use as Directed on the Package   Dapagliflozin-metFORMIN HCl ER 12-998 MG TB24 Take 2 tablets by mouth daily.   esomeprazole (NEXIUM) 40 MG capsule Take 1 capsule (40 mg total) by mouth 2 (two) times daily before a meal.   fluconazole (DIFLUCAN) 150 MG tablet Take by mouth.   HYDROcodone-acetaminophen (NORCO/VICODIN) 5-325 MG tablet Take 1 tablet by mouth every 8 (eight) hours.   icosapent Ethyl (VASCEPA) 1 g capsule Take 2 capsules (2 g total) by mouth 2 (two) times daily.   inFLIXimab (REMICADE) 100 MG injection Inject 100 mg into the vein every 8 (eight) weeks.   lisinopril-hydrochlorothiazide (ZESTORETIC) 20-12.5 MG tablet Take 2 tablets by mouth daily. Please make overdue appt with Dr. Burt Knack before anymore refills. 1st attempt   meloxicam (MOBIC) 15 MG tablet Take 1 tablet (  15 mg total) by mouth daily.   methylPREDNISolone (MEDROL DOSEPAK) 4 MG TBPK tablet 6 day dose pack - take as directed   metoprolol tartrate (LOPRESSOR) 50 MG tablet TAKE 1 TABLET BY MOUTH TWICE DAILY   Multiple Vitamin (MULTIVITAMIN WITH MINERALS) TABS tablet Take 1 tablet by mouth daily.   omeprazole (PRILOSEC) 40 MG capsule Take 1 capsule by mouth daily.   ondansetron (ZOFRAN-ODT) 8 MG disintegrating tablet Take 8 mg by mouth every 8 (eight) hours as needed.   potassium chloride (KLOR-CON) 10 MEQ tablet Take 10 mEq by mouth daily.      Allergies:    Hydrocodone-acetaminophen, Ezetimibe, Rosuvastatin, and Simvastatin   Social History   Socioeconomic History   Marital status: Single    Spouse name: Not on file   Number of children: Not on file   Years of education: Not on file   Highest education level: Not on file  Occupational History   Not on file  Tobacco Use   Smoking status: Former   Smokeless tobacco: Never   Tobacco comments:    10 packs/year  Substance and Sexual Activity   Alcohol use: Yes    Comment: rarely    Drug use: No   Sexual activity: Never  Other Topics Concern   Not on file  Social History Narrative   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 A 10-pack-year history of smoking, still smoking. Occasional alcohol use. No drug use.    Social Determinants of Health   Financial Resource Strain: Not on file  Food Insecurity: Not on file  Transportation Needs: Not on file  Physical Activity: Not on file  Stress: Not on file  Social Connections: Not on file     Family History: The patient's family history includes Heart attack in her maternal grandfather; Hypertension in her mother; Stroke in her maternal grandmother.  ROS:   Please see the history of present illness.  All other systems reviewed and are negative.  Labs/Other Studies Reviewed:    The following studies were reviewed today:  ETT 10/21  Fair exercise capacity, achieved 8 METS Hypertensive response to exercise, peak BP 192/96 No stress EKG evidence of ischemia  Myoview 2/13  Myoview 09/05/2011 EF 82, no scar or ischemia, normal study   Recent Labs: 10/02/2020: ALT 30; BUN 11; Creatinine, Ser 0.89; Potassium 4.3; Sodium 138  Recent Lipid Panel    Component Value Date/Time   CHOL 157 10/02/2020 1214   TRIG 304 (H) 10/02/2020 1214   HDL 35 (L) 10/02/2020 1214   CHOLHDL 4.5 (H) 10/02/2020 1214   CHOLHDL 4 04/26/2012 0924   VLDL 38.6 04/26/2012 0924   LDLCALC 73 10/02/2020 1214      Physical Exam:    VS:  BP 112/80     Pulse 77    Ht _0  (1.575 m)    Wt 191 lb (86.6 kg)    SpO2 98%    BMI 34.93 kg/m     Wt Readings from Last 3 Encounters:  08/20/21 191 lb (86.6 kg)  02/11/21 195 lb (88.5 kg)  03/06/20 205 lb (93 kg)     GEN:  Well nourished, well developed in no acute distress HEENT: Normal NECK: No JVD; No carotid bruits LYMPHATICS: No lymphadenopathy CARDIAC: RRR, no murmurs, rubs, gallops RESPIRATORY:  Clear to auscultation without rales, wheezing or rhonchi  ABDOMEN: Soft, non-tender, non-distended MUSCULOSKELETAL:  No edema; No deformity, 2+ pedal pulses equal bilaterally SKIN: Warm and dry NEUROLOGIC:  Alert  and oriented x 3 PSYCHIATRIC:  Normal affect   EKG:  EKG is ordered today.  The ekg ordered today demonstrates NSR at rate of 77 bpm, poor RWP, low voltage precordial leads, no acute change from previous  Diagnoses:    1. Hyperlipidemia LDL goal <70   2. Coronary artery disease involving native coronary artery of native heart without angina pectoris   3. Essential hypertension    Assessment and Plan:     CAD without angina: She denies chest pain, dyspnea, or other symptoms concerning for angina. No indication for further evaluation of ischemia at this time. Encouraged 150 minutes moderate intensity exercise each week, low sodium heart healthy diet, and weight loss. Mediterranean diet information given. Continue aspirin, metoprolol,  statin, PCSK9-I, Vascepa.   Essential hypertension: Blood pressure is stable today.  She reports better compliance with medications and no recent elevated blood pressure readings. Continue metoprolol, lisinopril/HCTZ.   Hyperlipidemia LDL goal < 70: LDL 73 on 10/02/20. She has worked with our lipid clinic to get [place] and Praluent at an affordable price. Encouraged her to notify us if price increases beyond her budget. Will recheck lipid/liver panel today. Continue Praluent, Vascepa, lipitor.   DM: A1C 8.6 on 04/25/21.  As noted above encouraged regular  physical activity, heart healthy diet, and weight loss.  Management per PCP.      Disposition: 1 year with Dr. Burt Knack   Medication Adjustments/Labs and Tests Ordered: Current medicines are reviewed at length with the patient today.  Concerns regarding medicines are outlined above.  No orders of the defined types were placed in this encounter.  No orders of the defined types were placed in this encounter.   There are no Patient Instructions on file for this visit.   Signed, Emmaline Life, NP  08/20/2021 9:44 AM    [place]

## 2021-08-20 ENCOUNTER — Encounter: Payer: Self-pay | Admitting: Nurse Practitioner

## 2021-08-20 ENCOUNTER — Other Ambulatory Visit: Payer: Self-pay

## 2021-08-20 ENCOUNTER — Ambulatory Visit (INDEPENDENT_AMBULATORY_CARE_PROVIDER_SITE_OTHER): Payer: BC Managed Care – PPO | Admitting: Nurse Practitioner

## 2021-08-20 VITALS — BP 112/80 | HR 77 | Ht 62.0 in | Wt 191.0 lb

## 2021-08-20 DIAGNOSIS — I2510 Atherosclerotic heart disease of native coronary artery without angina pectoris: Secondary | ICD-10-CM

## 2021-08-20 DIAGNOSIS — I10 Essential (primary) hypertension: Secondary | ICD-10-CM

## 2021-08-20 DIAGNOSIS — E785 Hyperlipidemia, unspecified: Secondary | ICD-10-CM

## 2021-08-20 LAB — COMPREHENSIVE METABOLIC PANEL
ALT: 27 IU/L (ref 0–32)
AST: 26 IU/L (ref 0–40)
Albumin/Globulin Ratio: 1.8 (ref 1.2–2.2)
Albumin: 4.4 g/dL (ref 3.8–4.8)
Alkaline Phosphatase: 83 IU/L (ref 44–121)
BUN/Creatinine Ratio: 11 (ref 9–23)
BUN: 9 mg/dL (ref 6–24)
Bilirubin Total: 0.5 mg/dL (ref 0.0–1.2)
CO2: 24 mmol/L (ref 20–29)
Calcium: 9.6 mg/dL (ref 8.7–10.2)
Chloride: 98 mmol/L (ref 96–106)
Creatinine, Ser: 0.84 mg/dL (ref 0.57–1.00)
Globulin, Total: 2.5 g/dL (ref 1.5–4.5)
Glucose: 174 mg/dL — ABNORMAL HIGH (ref 70–99)
Potassium: 4.1 mmol/L (ref 3.5–5.2)
Sodium: 137 mmol/L (ref 134–144)
Total Protein: 6.9 g/dL (ref 6.0–8.5)
eGFR: 87 mL/min/{1.73_m2} (ref >59)

## 2021-08-20 LAB — LIPID PANEL
Chol/HDL Ratio: 4.1 ratio (ref 0.0–4.4)
Cholesterol, Total: 161 mg/dL (ref 100–199)
HDL: 39 mg/dL — ABNORMAL LOW (ref >39)
LDL Chol Calc (NIH): 83 mg/dL (ref 0–99)
Triglycerides: 233 mg/dL — ABNORMAL HIGH (ref 0–149)
VLDL Cholesterol Cal: 39 mg/dL (ref 5–40)

## 2021-08-20 NOTE — Patient Instructions (Signed)
Medication Instructions:  Your physician recommends that you continue on your current medications as directed. Please refer to the Current Medication list given to you today.  *If you need a refill on your cardiac medications before your next appointment, please call your pharmacy*   Lab Work: TODAY - CMET, Lipid panel If you have labs (blood work) drawn today and your tests are completely normal, you will receive your results only by: [place] (if you have MyChart) OR A paper copy in the mail If you have any lab test that is abnormal or we need to change your treatment, we will call you to review the results.   Testing/Procedures: None Ordered   Follow-Up: At [facility], you and your health needs are our priority.  As part of our continuing mission to provide you with exceptional heart care, we have created designated Provider Care Teams.  These Care Teams include your primary Cardiologist (physician) and Advanced Practice Providers (APPs -  Physician Assistants and Nurse Practitioners) who all work together to provide you with the care you need, when you need it.  We recommend signing up for the patient portal called "MyChart".  Sign up information is provided on this After Visit Summary.  MyChart is used to connect with patients for Virtual Visits (Telemedicine).  Patients are able to view lab/test results, encounter notes, upcoming appointments, etc.  Non-urgent messages can be sent to your provider as well.   To learn more about what you can do with MyChart, go to [email or web address].    Your next appointment:   1 year(s)  The format for your next appointment:   In Person  Provider:   Sherren Mocha, MD {     Other Instructions Mediterranean Diet A Mediterranean diet refers to food and lifestyle choices that are based on the traditions of countries located on the [place]. It focuses on eating more fruits, vegetables, whole grains, beans, nuts, seeds,  and heart-healthy fats, and eating less dairy, meat, eggs, and processed foods with added sugar, salt, and fat. This way of eating has been shown to help prevent certain conditions and improve outcomes for people who have chronic diseases, like kidney disease and heart disease. What are tips for following this plan? Reading food labels Check the serving size of packaged foods. For foods such as rice and pasta, the serving size refers to the amount of cooked product, not dry. Check the total fat in packaged foods. Avoid foods that have saturated fat or trans fats. Check the ingredient list for added sugars, such as corn syrup. Shopping  Buy a variety of foods that offer a balanced diet, including: Fresh fruits and vegetables (produce). Grains, beans, nuts, and seeds. Some of these may be available in unpackaged forms or large amounts (in bulk). Fresh seafood. Poultry and eggs. Low-fat dairy products. Buy whole ingredients instead of prepackaged foods. Buy fresh fruits and vegetables in-season from local farmers markets. Buy plain frozen fruits and vegetables. If you do not have access to quality fresh seafood, buy precooked frozen shrimp or canned fish, such as tuna, salmon, or sardines. Stock your pantry so you always have certain foods on hand, such as olive oil, canned tuna, canned tomatoes, rice, pasta, and beans. Cooking Cook foods with extra-virgin olive oil instead of using butter or other vegetable oils. Have meat as a side dish, and have vegetables or grains as your main dish. This means having meat in small portions or adding small amounts of meat to foods  like pasta or stew. Use beans or vegetables instead of meat in common dishes like chili or lasagna. Experiment with different cooking methods. Try roasting, broiling, steaming, and sauting vegetables. Add frozen vegetables to soups, stews, pasta, or rice. Add nuts or seeds for added healthy fats and plant protein at each meal.  You can add these to yogurt, salads, or vegetable dishes. Marinate fish or vegetables using olive oil, lemon juice, garlic, and fresh herbs. Meal planning Plan to eat one vegetarian meal one day each week. Try to work up to two vegetarian meals, if possible. Eat seafood two or more times a week. Have healthy snacks readily available, such as: Vegetable sticks with hummus. Greek yogurt. Fruit and nut trail mix. Eat balanced meals throughout the week. This includes: Fruit: 2-3 servings a day. Vegetables: 4-5 servings a day. Low-fat dairy: 2 servings a day. Fish, poultry, or lean meat: 1 serving a day. Beans and legumes: 2 or more servings a week. Nuts and seeds: 1-2 servings a day. Whole grains: 6-8 servings a day. Extra-virgin olive oil: 3-4 servings a day. Limit red meat and sweets to only a few servings a month. Lifestyle  Cook and eat meals together with your family, when possible. Drink enough fluid to keep your urine pale yellow. Be physically active every day. This includes: Aerobic exercise like running or swimming. Leisure activities like gardening, walking, or housework. Get 7-8 hours of sleep each night. If recommended by your health care provider, drink red wine in moderation. This means 1 glass a day for nonpregnant women and 2 glasses a day for men. A glass of wine equals 5 oz (150 mL). What foods should I eat? Fruits Apples. Apricots. Avocado. Berries. Bananas. Cherries. Dates. Figs. Grapes. Lemons. Melon. Oranges. Peaches. Plums. Pomegranate. Vegetables Artichokes. Beets. Broccoli. Cabbage. Carrots. Eggplant. Green beans. Chard. Kale. Spinach. Onions. Leeks. Peas. Squash. Tomatoes. Peppers. Radishes. Grains Whole-grain pasta. Brown rice. Bulgur wheat. Polenta. Couscous. Whole-wheat bread. [place]. Meats and other proteins Beans. Almonds. Sunflower seeds. Pine nuts. Peanuts. [place]. Salmon. Scallops. Shrimp. [place]. Tilapia. Clams. Oysters. Eggs. Poultry without  skin. Dairy Low-fat milk. Cheese. Greek yogurt. Fats and oils Extra-virgin olive oil. Avocado oil. Grapeseed oil. Beverages Water. Red wine. Herbal tea. Sweets and desserts Greek yogurt with honey. Baked apples. Poached pears. Trail mix. Seasonings and condiments Basil. Cilantro. Coriander. Cumin. Mint. Parsley. Sage. Rosemary. Tarragon. Garlic. Oregano. Thyme. Pepper. Balsamic vinegar. Tahini. Hummus. Tomato sauce. Olives. Mushrooms. The items listed above may not be a complete list of foods and beverages you can eat. Contact a dietitian for more information. What foods should I limit? This is a list of foods that should be eaten rarely or only on special occasions. Fruits Fruit canned in syrup. Vegetables Deep-fried potatoes (french fries). Grains Prepackaged pasta or rice dishes. Prepackaged cereal with added sugar. Prepackaged snacks with added sugar. Meats and other proteins Beef. Pork. Lamb. Poultry with skin. Hot dogs. Berniece Salines. Dairy Ice cream. Sour cream. Whole milk. Fats and oils Butter. Canola oil. Vegetable oil. Beef fat (tallow). Lard. Beverages Juice. Sugar-sweetened soft drinks. Beer. Liquor and spirits. Sweets and desserts Cookies. Cakes. Pies. Candy. Seasonings and condiments Mayonnaise. Pre-made sauces and marinades. The items listed above may not be a complete list of foods and beverages you should limit. Contact a dietitian for more information. Summary The Mediterranean diet includes both food and lifestyle choices. Eat a variety of fresh fruits and vegetables, beans, nuts, seeds, and whole grains. Limit the amount of red meat and  sweets that you eat. If recommended by your health care provider, drink red wine in moderation. This means 1 glass a day for nonpregnant women and 2 glasses a day for men. A glass of wine equals 5 oz (150 mL). This information is not intended to replace advice given to you by your health care provider. Make sure you discuss any  questions you have with your health care provider. Document Revised: 08/26/2019 Document Reviewed: 06/23/2019 Elsevier Patient Education  2022 [employer].

## 2021-08-21 ENCOUNTER — Other Ambulatory Visit: Payer: Self-pay | Admitting: Cardiovascular Disease

## 2021-08-21 ENCOUNTER — Other Ambulatory Visit: Payer: Self-pay | Admitting: Physician Assistant

## 2021-11-02 ENCOUNTER — Other Ambulatory Visit: Payer: Self-pay | Admitting: Cardiovascular Disease

## 2021-11-02 DIAGNOSIS — E785 Hyperlipidemia, unspecified: Secondary | ICD-10-CM

## 2021-11-14 ENCOUNTER — Other Ambulatory Visit: Payer: Self-pay | Admitting: Cardiovascular Disease

## 2021-11-14 DIAGNOSIS — E785 Hyperlipidemia, unspecified: Secondary | ICD-10-CM

## 2021-11-15 MED ORDER — ICOSAPENT ETHYL 1 G PO CAPS: 2.0000 g | ORAL_CAPSULE | Freq: Two times a day (BID) | ORAL | 3 refills | Status: DC

## 2022-06-05 ENCOUNTER — Telehealth: Payer: Self-pay | Admitting: Cardiovascular Disease

## 2022-06-05 DIAGNOSIS — I2510 Atherosclerotic heart disease of native coronary artery without angina pectoris: Secondary | ICD-10-CM

## 2022-06-05 DIAGNOSIS — E785 Hyperlipidemia, unspecified: Secondary | ICD-10-CM

## 2022-06-05 NOTE — Telephone Encounter (Signed)
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

## 2022-06-05 NOTE — Telephone Encounter (Signed)
Will route to [place] who last saw patient to advise on which stress test she would like. Pt had a stress myoview in 2013 and last was an ETT on 05/29/2020.

## 2022-06-07 NOTE — Telephone Encounter (Signed)
I agree to order the type of test that is needed for her physical if she could advise US of the recommendation.

## 2022-06-09 NOTE — Telephone Encounter (Signed)
Returned call to patient who states she was advised the type of stress test didn't matter (nuclear vs. Exercise) but she believes the exercise treadmill test is what they prefer. Order placed at this time for ETT/GXT and routed to [facility] to sign attestation form. Pt aware she will be called to schedule.

## 2022-06-24 ENCOUNTER — Ambulatory Visit: Payer: BC Managed Care – PPO

## 2022-06-30 ENCOUNTER — Telehealth: Payer: Self-pay | Admitting: Cardiovascular Disease

## 2022-06-30 NOTE — Telephone Encounter (Signed)
Spoke with patient and she had declined to do the GXT.   She stated she don't need to do the test.  Order canceled.

## 2022-08-30 ENCOUNTER — Other Ambulatory Visit: Payer: Self-pay | Admitting: Cardiovascular Disease

## 2022-09-09 ENCOUNTER — Other Ambulatory Visit (HOSPITAL_COMMUNITY): Payer: Self-pay

## 2022-09-10 ENCOUNTER — Telehealth: Payer: Self-pay

## 2022-09-10 NOTE — Telephone Encounter (Signed)
Pharmacy Patient Advocate Encounter  Prior Authorization for Repatha 140mg /ml has been approved.    key# [id number] Effective dates: 02.6.24 through 09/10/23  Please note that Rx praluent isnt currently on the pts formulary.

## 2022-09-22 ENCOUNTER — Encounter: Payer: Self-pay | Admitting: Pharmacist

## 2022-09-22 MED ORDER — REPATHA SURECLICK 140 MG/ML ~~LOC~~ SOAJ: 1.0000 | SUBCUTANEOUS | 3 refills | Status: DC

## 2022-09-22 NOTE — Addendum Note (Signed)
Addended by: Jarian Longoria E on: 09/22/2022 08:18 AM   Modules accepted: Orders

## 2022-09-22 NOTE — Telephone Encounter (Addendum)
Called pt to explain change from Praluent to [place]. She states she actually stopped taking Praluent due to cost. Explained that now Repatha is preferred on her formulary and she qualifies for copay card that will bring price down to $5/1 month or $15/3 months which is better for her as well. Rx sent to pharmacy, pt appreciative for the call.  Copay card info sent to pt in [place] message: [place]: [place]: CN RxGrp: [id number] ID: [id number]

## 2022-12-03 ENCOUNTER — Encounter (HOSPITAL_COMMUNITY): Payer: Self-pay

## 2022-12-03 ENCOUNTER — Inpatient Hospital Stay (HOSPITAL_COMMUNITY)
Admission: EM | Admit: 2022-12-03 | Discharge: 2022-12-07 | DRG: 871 | Disposition: A | Discharge status: Home / Self Care | Payer: BC Managed Care – PPO | Attending: Internal Medicine | Admitting: Internal Medicine

## 2022-12-03 ENCOUNTER — Other Ambulatory Visit: Payer: Self-pay

## 2022-12-03 ENCOUNTER — Emergency Department (HOSPITAL_COMMUNITY): Payer: BC Managed Care – PPO

## 2022-12-03 DIAGNOSIS — I10 Essential (primary) hypertension: Secondary | ICD-10-CM | POA: Diagnosis present

## 2022-12-03 DIAGNOSIS — E860 Dehydration: Secondary | ICD-10-CM | POA: Diagnosis present

## 2022-12-03 DIAGNOSIS — E871 Hypo-osmolality and hyponatremia: Secondary | ICD-10-CM | POA: Diagnosis present

## 2022-12-03 DIAGNOSIS — J189 Pneumonia, unspecified organism: Secondary | ICD-10-CM | POA: Diagnosis not present

## 2022-12-03 DIAGNOSIS — Z79899 Other long term (current) drug therapy: Secondary | ICD-10-CM

## 2022-12-03 DIAGNOSIS — Z6836 Body mass index (BMI) 36.0-36.9, adult: Secondary | ICD-10-CM

## 2022-12-03 DIAGNOSIS — J918 Pleural effusion in other conditions classified elsewhere: Secondary | ICD-10-CM | POA: Diagnosis present

## 2022-12-03 DIAGNOSIS — T502X5A Adverse effect of carbonic-anhydrase inhibitors, benzothiadiazides and other diuretics, initial encounter: Secondary | ICD-10-CM | POA: Diagnosis present

## 2022-12-03 DIAGNOSIS — K219 Gastro-esophageal reflux disease without esophagitis: Secondary | ICD-10-CM | POA: Diagnosis present

## 2022-12-03 DIAGNOSIS — F17200 Nicotine dependence, unspecified, uncomplicated: Secondary | ICD-10-CM | POA: Diagnosis present

## 2022-12-03 DIAGNOSIS — Z823 Family history of stroke: Secondary | ICD-10-CM

## 2022-12-03 DIAGNOSIS — D638 Anemia in other chronic diseases classified elsewhere: Secondary | ICD-10-CM | POA: Diagnosis present

## 2022-12-03 DIAGNOSIS — Z7984 Long term (current) use of oral hypoglycemic drugs: Secondary | ICD-10-CM

## 2022-12-03 DIAGNOSIS — K5080 Crohn's disease of both small and large intestine without complications: Secondary | ICD-10-CM | POA: Diagnosis present

## 2022-12-03 DIAGNOSIS — A419 Sepsis, unspecified organism: Secondary | ICD-10-CM | POA: Diagnosis not present

## 2022-12-03 DIAGNOSIS — Z885 Allergy status to narcotic agent status: Secondary | ICD-10-CM

## 2022-12-03 DIAGNOSIS — Z7982 Long term (current) use of aspirin: Secondary | ICD-10-CM

## 2022-12-03 DIAGNOSIS — Z791 Long term (current) use of non-steroidal anti-inflammatories (NSAID): Secondary | ICD-10-CM

## 2022-12-03 DIAGNOSIS — E785 Hyperlipidemia, unspecified: Secondary | ICD-10-CM | POA: Diagnosis present

## 2022-12-03 DIAGNOSIS — Z888 Allergy status to other drugs, medicaments and biological substances status: Secondary | ICD-10-CM

## 2022-12-03 DIAGNOSIS — I2510 Atherosclerotic heart disease of native coronary artery without angina pectoris: Secondary | ICD-10-CM | POA: Diagnosis present

## 2022-12-03 DIAGNOSIS — E876 Hypokalemia: Secondary | ICD-10-CM | POA: Diagnosis present

## 2022-12-03 DIAGNOSIS — Z8249 Family history of ischemic heart disease and other diseases of the circulatory system: Secondary | ICD-10-CM

## 2022-12-03 DIAGNOSIS — I252 Old myocardial infarction: Secondary | ICD-10-CM

## 2022-12-03 DIAGNOSIS — Z1152 Encounter for screening for COVID-19: Secondary | ICD-10-CM

## 2022-12-03 DIAGNOSIS — E1165 Type 2 diabetes mellitus with hyperglycemia: Secondary | ICD-10-CM | POA: Diagnosis present

## 2022-12-03 LAB — HIV ANTIBODY (ROUTINE TESTING W REFLEX): HIV Screen 4th Generation wRfx: NONREACTIVE

## 2022-12-03 LAB — URINALYSIS, ROUTINE W REFLEX MICROSCOPIC
Bilirubin Urine: NEGATIVE
Glucose, UA: NEGATIVE mg/dL
Hgb urine dipstick: NEGATIVE
Ketones, ur: NEGATIVE mg/dL
Leukocytes,Ua: NEGATIVE
Nitrite: NEGATIVE
Protein, ur: 100 mg/dL — AB
Specific Gravity, Urine: 1.02 (ref 1.005–1.030)
pH: 6 (ref 5.0–8.0)

## 2022-12-03 LAB — HEMOGLOBIN A1C
Hgb A1c MFr Bld: 7.6 % — ABNORMAL HIGH (ref 4.8–5.6)
Mean Plasma Glucose: 171.42 mg/dL

## 2022-12-03 LAB — COMPREHENSIVE METABOLIC PANEL
ALT: 25 U/L (ref 0–44)
AST: 22 U/L (ref 15–41)
Albumin: 3.6 g/dL (ref 3.5–5.0)
Alkaline Phosphatase: 68 U/L (ref 38–126)
Anion gap: 12 (ref 5–15)
BUN: 11 mg/dL (ref 6–20)
CO2: 22 mmol/L (ref 22–32)
Calcium: 8.7 mg/dL — ABNORMAL LOW (ref 8.9–10.3)
Chloride: 94 mmol/L — ABNORMAL LOW (ref 98–111)
Creatinine, Ser: 0.91 mg/dL (ref 0.44–1.00)
GFR, Estimated: >60 mL/min (ref >60)
Glucose, Bld: 259 mg/dL — ABNORMAL HIGH (ref 70–99)
Potassium: 3.4 mmol/L — ABNORMAL LOW (ref 3.5–5.1)
Sodium: 128 mmol/L — ABNORMAL LOW (ref 135–145)
Total Bilirubin: 1.4 mg/dL — ABNORMAL HIGH (ref 0.3–1.2)
Total Protein: 8 g/dL (ref 6.5–8.1)

## 2022-12-03 LAB — CBC WITH DIFFERENTIAL/PLATELET
Abs Immature Granulocytes: 0.08 10*3/uL — ABNORMAL HIGH (ref 0.00–0.07)
Basophils Absolute: 0.1 10*3/uL (ref 0.0–0.1)
Basophils Relative: 0 %
Eosinophils Absolute: 0 10*3/uL (ref 0.0–0.5)
Eosinophils Relative: 0 %
HCT: 32.6 % — ABNORMAL LOW (ref 36.0–46.0)
Hemoglobin: 11.4 g/dL — ABNORMAL LOW (ref 12.0–15.0)
Immature Granulocytes: 0 %
Lymphocytes Relative: 17 %
Lymphs Abs: 3.2 10*3/uL (ref 0.7–4.0)
MCH: 32.5 pg (ref 26.0–34.0)
MCHC: 35 g/dL (ref 30.0–36.0)
MCV: 92.9 fL (ref 80.0–100.0)
Monocytes Absolute: 2.2 10*3/uL — ABNORMAL HIGH (ref 0.1–1.0)
Monocytes Relative: 12 %
Neutro Abs: 13.5 10*3/uL — ABNORMAL HIGH (ref 1.7–7.7)
Neutrophils Relative %: 71 %
Platelets: 317 10*3/uL (ref 150–400)
RBC: 3.51 MIL/uL — ABNORMAL LOW (ref 3.87–5.11)
RDW: 11.8 % (ref 11.5–15.5)
WBC: 19.1 10*3/uL — ABNORMAL HIGH (ref 4.0–10.5)
nRBC: 0 % (ref 0.0–0.2)

## 2022-12-03 LAB — C DIFFICILE QUICK SCREEN W PCR REFLEX
C Diff antigen: NEGATIVE
C Diff interpretation: NOT DETECTED
C Diff toxin: NEGATIVE

## 2022-12-03 LAB — LACTIC ACID, PLASMA
Lactic Acid, Venous: 1.5 mmol/L (ref 0.5–1.9)
Lactic Acid, Venous: 1.7 mmol/L (ref 0.5–1.9)

## 2022-12-03 LAB — MRSA NEXT GEN BY PCR, NASAL: MRSA by PCR Next Gen: NOT DETECTED

## 2022-12-03 LAB — RESP PANEL BY RT-PCR (RSV, FLU A&B, COVID)  RVPGX2
Influenza A by PCR: NEGATIVE
Influenza B by PCR: NEGATIVE
Resp Syncytial Virus by PCR: NEGATIVE
SARS Coronavirus 2 by RT PCR: NEGATIVE

## 2022-12-03 LAB — GLUCOSE, CAPILLARY
Glucose-Capillary: 175 mg/dL — ABNORMAL HIGH (ref 70–99)
Glucose-Capillary: 213 mg/dL — ABNORMAL HIGH (ref 70–99)
Glucose-Capillary: 182 mg/dL — ABNORMAL HIGH (ref 70–99)
Glucose-Capillary: 162 mg/dL — ABNORMAL HIGH (ref 70–99)

## 2022-12-03 LAB — PROTIME-INR
INR: 1.2 (ref 0.8–1.2)
Prothrombin Time: 14.8 seconds (ref 11.4–15.2)

## 2022-12-03 LAB — APTT: aPTT: 37 seconds — ABNORMAL HIGH (ref 24–36)

## 2022-12-03 LAB — HCG, QUANTITATIVE, PREGNANCY: hCG, Beta Chain, Quant, S: 2 m[IU]/mL (ref <5)

## 2022-12-03 MED ORDER — ENOXAPARIN SODIUM 40 MG/0.4ML IJ SOSY
40.0000 mg | PREFILLED_SYRINGE | INTRAMUSCULAR | Status: DC
  Administered 2022-12-03 – 2022-12-04 (×2): 40 mg via SUBCUTANEOUS
  Filled 2022-12-03 (×2): qty 0.4

## 2022-12-03 MED ORDER — METRONIDAZOLE 500 MG/100ML IV SOLN
500.0000 mg | Freq: Once | INTRAVENOUS | Status: AC
  Administered 2022-12-03: 500 mg via INTRAVENOUS
  Filled 2022-12-03: qty 100

## 2022-12-03 MED ORDER — ACETAMINOPHEN 325 MG PO TABS
650.0000 mg | ORAL_TABLET | Freq: Four times a day (QID) | ORAL | Status: DC | PRN
  Administered 2022-12-03: 650 mg via ORAL
  Filled 2022-12-03: qty 2

## 2022-12-03 MED ORDER — CHLORHEXIDINE GLUCONATE CLOTH 2 % EX PADS
6.0000 | MEDICATED_PAD | Freq: Every day | CUTANEOUS | Status: DC
  Administered 2022-12-03 – 2022-12-04 (×2): 6 via TOPICAL

## 2022-12-03 MED ORDER — PANTOPRAZOLE SODIUM 40 MG PO TBEC
40.0000 mg | DELAYED_RELEASE_TABLET | Freq: Every day | ORAL | Status: DC
  Administered 2022-12-04 – 2022-12-07 (×4): 40 mg via ORAL
  Filled 2022-12-03 (×4): qty 1

## 2022-12-03 MED ORDER — SODIUM CHLORIDE 0.9 % IV SOLN
2.0000 g | INTRAVENOUS | Status: DC
  Administered 2022-12-03 – 2022-12-06 (×4): 2 g via INTRAVENOUS
  Filled 2022-12-03 (×4): qty 20

## 2022-12-03 MED ORDER — LACTATED RINGERS IV SOLN: 150.0000 mL/h | INTRAVENOUS | Status: DC

## 2022-12-03 MED ORDER — ONDANSETRON HCL 4 MG/2ML IJ SOLN: 4.0000 mg | Freq: Four times a day (QID) | INTRAMUSCULAR | Status: DC | PRN

## 2022-12-03 MED ORDER — SODIUM CHLORIDE 0.9 % IV SOLN
500.0000 mg | INTRAVENOUS | Status: DC
  Administered 2022-12-03 – 2022-12-06 (×4): 500 mg via INTRAVENOUS
  Filled 2022-12-03 (×4): qty 5

## 2022-12-03 MED ORDER — NICOTINE 7 MG/24HR TD PT24
7.0000 mg | MEDICATED_PATCH | Freq: Every day | TRANSDERMAL | Status: DC
  Administered 2022-12-03 – 2022-12-07 (×5): 7 mg via TRANSDERMAL
  Filled 2022-12-03 (×5): qty 1

## 2022-12-03 MED ORDER — LACTATED RINGERS IV BOLUS (SEPSIS)
1000.0000 mL | Freq: Once | INTRAVENOUS | Status: AC
  Administered 2022-12-03: 1000 mL via INTRAVENOUS

## 2022-12-03 MED ORDER — ONDANSETRON HCL 4 MG PO TABS: 4.0000 mg | ORAL_TABLET | Freq: Four times a day (QID) | ORAL | Status: DC | PRN

## 2022-12-03 MED ORDER — ACETAMINOPHEN 500 MG PO TABS
1000.0000 mg | ORAL_TABLET | Freq: Once | ORAL | Status: AC
  Administered 2022-12-03: 1000 mg via ORAL
  Filled 2022-12-03: qty 2

## 2022-12-03 MED ORDER — ALBUTEROL SULFATE (2.5 MG/3ML) 0.083% IN NEBU
2.5000 mg | INHALATION_SOLUTION | Freq: Once | RESPIRATORY_TRACT | Status: AC | PRN
  Administered 2022-12-03: 2.5 mg via RESPIRATORY_TRACT
  Filled 2022-12-03: qty 3

## 2022-12-03 MED ORDER — ATORVASTATIN CALCIUM 20 MG PO TABS
20.0000 mg | ORAL_TABLET | Freq: Every day | ORAL | Status: DC
  Administered 2022-12-03 – 2022-12-06 (×4): 20 mg via ORAL
  Filled 2022-12-03 (×5): qty 1
  Filled 2022-12-03: qty 2

## 2022-12-03 MED ORDER — SODIUM CHLORIDE 0.9 % IV SOLN
2.0000 g | Freq: Once | INTRAVENOUS | Status: AC
  Administered 2022-12-03: 2 g via INTRAVENOUS
  Filled 2022-12-03: qty 12.5

## 2022-12-03 MED ORDER — ICOSAPENT ETHYL 1 G PO CAPS: 2.0000 g | ORAL_CAPSULE | Freq: Two times a day (BID) | ORAL | Status: DC

## 2022-12-03 MED ORDER — METOPROLOL TARTRATE 25 MG PO TABS
25.0000 mg | ORAL_TABLET | Freq: Two times a day (BID) | ORAL | Status: DC
  Administered 2022-12-03 – 2022-12-07 (×9): 25 mg via ORAL
  Filled 2022-12-03 (×9): qty 1

## 2022-12-03 MED ORDER — IBUPROFEN 200 MG PO TABS
400.0000 mg | ORAL_TABLET | Freq: Four times a day (QID) | ORAL | Status: DC | PRN
  Administered 2022-12-03 (×2): 400 mg via ORAL
  Filled 2022-12-03 (×2): qty 2

## 2022-12-03 MED ORDER — ADULT MULTIVITAMIN W/MINERALS CH
1.0000 | ORAL_TABLET | Freq: Every day | ORAL | Status: DC
  Administered 2022-12-04 – 2022-12-07 (×4): 1 via ORAL
  Filled 2022-12-03 (×4): qty 1

## 2022-12-03 MED ORDER — LACTATED RINGERS IV BOLUS (SEPSIS)
600.0000 mL | Freq: Once | INTRAVENOUS | Status: AC
  Administered 2022-12-03: 600 mL via INTRAVENOUS

## 2022-12-03 MED ORDER — PANTOPRAZOLE SODIUM 40 MG PO TBEC: 40.0000 mg | DELAYED_RELEASE_TABLET | Freq: Every day | ORAL | Status: DC

## 2022-12-03 MED ORDER — INSULIN ASPART 100 UNIT/ML IJ SOLN
0.0000 [IU] | Freq: Three times a day (TID) | INTRAMUSCULAR | Status: DC
  Administered 2022-12-03: 3 [IU] via SUBCUTANEOUS
  Administered 2022-12-03 – 2022-12-04 (×2): 5 [IU] via SUBCUTANEOUS
  Administered 2022-12-04: 3 [IU] via SUBCUTANEOUS
  Administered 2022-12-04 – 2022-12-05 (×2): 2 [IU] via SUBCUTANEOUS
  Administered 2022-12-05 (×2): 5 [IU] via SUBCUTANEOUS
  Administered 2022-12-06: 8 [IU] via SUBCUTANEOUS
  Administered 2022-12-06: 3 [IU] via SUBCUTANEOUS
  Administered 2022-12-07: 2 [IU] via SUBCUTANEOUS

## 2022-12-03 MED ORDER — ORAL CARE MOUTH RINSE: 15.0000 mL | OROMUCOSAL | Status: DC | PRN

## 2022-12-03 MED ORDER — VANCOMYCIN HCL IN DEXTROSE 1-5 GM/200ML-% IV SOLN: 1000.0000 mg | Freq: Once | INTRAVENOUS | Status: DC

## 2022-12-03 MED ORDER — ASPIRIN 81 MG PO TBEC
81.0000 mg | DELAYED_RELEASE_TABLET | Freq: Every day | ORAL | Status: DC
  Administered 2022-12-03 – 2022-12-07 (×5): 81 mg via ORAL
  Filled 2022-12-03 (×5): qty 1

## 2022-12-03 MED ORDER — VANCOMYCIN HCL 1750 MG/350ML IV SOLN
1750.0000 mg | Freq: Once | INTRAVENOUS | Status: AC
  Administered 2022-12-03: 1750 mg via INTRAVENOUS
  Filled 2022-12-03: qty 350

## 2022-12-03 MED ORDER — LACTATED RINGERS IV SOLN: INTRAVENOUS | Status: AC

## 2022-12-03 NOTE — Assessment & Plan Note (Signed)
As evidenced by fever with a Tmax of 102.6, tachycardia, tachypnea, marked leukocytosis and chest x-ray findings that showed a right lower lobe pneumonia Continue aggressive IV fluid resuscitation Treat patient empirically with Rocephin and Zithromax Follow-up results of blood cultures

## 2022-12-03 NOTE — Assessment & Plan Note (Signed)
Secondary to HCTZ use Expect improvement with IV fluid hydration

## 2022-12-03 NOTE — Assessment & Plan Note (Signed)
Stable Continue aspirin, metoprolol and atorvastatin

## 2022-12-03 NOTE — Assessment & Plan Note (Signed)
Secondary to HCTZ use Supplement potassium

## 2022-12-03 NOTE — Assessment & Plan Note (Signed)
Hold all oral hypoglycemic agents Check hemoglobin A1c levels Maintain consistent carbohydrate diet Glycemic control with sliding scale insulin

## 2022-12-03 NOTE — Progress Notes (Signed)
   12/03/22 1029  Assess: MEWS Score  Temp (!) 103.3 F (39.6 C)  BP (!) 150/78  MAP (mmHg) 99  Pulse Rate (!) 130  SpO2 96 %  O2 Device Room Air  Assess: MEWS Score  MEWS Temp 2  MEWS Systolic 0  MEWS Pulse 3  MEWS RR 0  MEWS LOC 0  MEWS Score 5  MEWS Score Color Red  Assess: if the MEWS score is Yellow or Red  Were vital signs taken at a resting state? Yes  Focused Assessment No change from prior assessment  Does the patient meet 2 or more of the SIRS criteria? Yes  Does the patient have a confirmed or suspected source of infection? Yes  MEWS guidelines implemented  Yes, red  Treat  MEWS Interventions Considered administering scheduled or prn medications/treatments as ordered  Take Vital Signs  Increase Vital Sign Frequency  Red: Q1hr x2, continue Q4hrs until patient remains green for 12hrs  Escalate  MEWS: Escalate Red: Discuss with charge nurse and notify provider. Consider notifying RRT. If remains red for 2 hours consider need for higher level of care  Notify: Charge Nurse/RN  Name of Charge Nurse/RN Notified Lovette Cliche RN  Provider Notification  Provider Name/Title Agbata  Date Provider Notified 12/03/22  Time Provider Notified 1030  Method of Notification Page  Notification Reason Change in status  Provider response See new orders  Date of Provider Response 12/03/22  Time of Provider Response 1040  Notify: Rapid Response  Name of Rapid Response RN Notified Ethan P.  Date Rapid Response Notified 12/03/22  Time Rapid Response Notified 1030  Assess: SIRS CRITERIA  SIRS Temperature  1  SIRS Pulse 1  SIRS Respirations  0  SIRS WBC 1  SIRS Score Sum  3

## 2022-12-03 NOTE — Assessment & Plan Note (Signed)
BMI 36.58 Complicates overall prognosis and care

## 2022-12-03 NOTE — Progress Notes (Signed)
A consult was received from an ED physician for vancomycin and cefepime per pharmacy dosing.  The patient's profile has been reviewed for ht/wt/allergies/indication/available labs.   A one time order has been placed for vancomycin 1750mg  and cefepime 2gm.    Further antibiotics/pharmacy consults should be ordered by admitting physician if indicated.                       Thank you, Arley Phenix RPh 12/03/2022, 4:15 AM

## 2022-12-03 NOTE — H&P (Signed)
History and Physical    Patient: [name] ZOX:[id number] DOB: 04-29-1976 DOA: 12/03/2022 DOS: the patient was seen and examined on 12/03/2022 PCP: Medicine, [facility]  Patient coming from: Home  Chief Complaint:  Chief Complaint  Patient presents with   Fever   HPI: [name] is a 47 y.o. female with medical history significant for coronary artery disease, obesity (BMI 36.58 kg/m2), hypertension, dyslipidemia, Crohn's disease, GERD who presents to the emergency room for evaluation of a 2-day history of right lower lateral chest wall pain associated with a cough and fever.  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  The pain is nonpleuritic but has been persistent and rated a 6 x 10 in intensity at its worst. She denies having any shortness of breath, no nausea, no vomiting, no abdominal pain, no changes in her bowel habits, no urinary symptoms, no dizziness, no lightheadedness, no leg swelling, no blurred vision or focal deficit. Upon arrival to the ER, her vital signs were Tmax 102.6, she was tachycardic with heart rate of 123 and tachypneic Chest x-ray showed new airspace consolidation in the right lower lobe concerning for pneumonia. She received sepsis IV fluids in the ER and a dose of cefepime, Flagyl and vancomycin She will be admitted to the hospital for further evaluation.    Review of Systems: As mentioned in the history of present illness. All other systems reviewed and are negative. Past Medical History:  Diagnosis Date   Coronary artery disease    ETT 10/21: Fair exercise capacity, achieved 8 METS, hypertensive blood pressure response, no ischemic EKG changes   GERD (gastroesophageal reflux disease)    Hyperlipidemia LDL goal <70    Hypertension    MI (myocardial infarction) [facility])    Nov 2010   Obesity    Past Surgical History:  Procedure Laterality Date   CARDIAC CATHETERIZATION     Promus  drug-eluting stent to the OM 1   Social History:  reports that she has quit smoking. She has never used smokeless tobacco. She reports current alcohol use. She reports that she does not use drugs.  Allergies  Allergen Reactions   Hydrocodone-Acetaminophen Itching    Generic that is white with red speckles caused itching. Patient states she can take this med.   Ezetimibe Swelling and Other (See Comments)    Swollen face, dark urine, fatigue   Simvastatin Other (See Comments)    Myalgias with 20mg  and 40mg    Rosuvastatin Hives, Itching and Rash     with 40mg  dosing    Family History  Problem Relation Age of Onset   Hypertension Mother    Stroke Maternal Grandmother    Heart attack Maternal Grandfather     Prior to Admission medications   Medication Sig Start Date End Date Taking? Authorizing Provider  acetaminophen (TYLENOL) 325 MG tablet Take 650 mg by mouth daily as needed for headache (pain).    [provider]  aspirin EC 81 MG tablet Take 1 tablet (81 mg total) by mouth daily. 01/06/11   Tonny Bollman, MD  atorvastatin (LIPITOR) 20 MG tablet Take 1 tablet by mouth once daily 09/01/22   Tonny Bollman, MD  Dapagliflozin-metFORMIN HCl ER 12-998 MG TB24 Take 2 tablets by mouth daily. 11/11/18   [provider]  esomeprazole (NEXIUM) 40 MG capsule Take 1 capsule (40 mg total) by mouth 2 (two) times daily before a meal. 08/24/13   Azalia Bilis, MD  Evolocumab (REPATHA SURECLICK) 140 MG/ML SOAJ Inject 140 mg into the skin every 14 (fourteen) days. 09/22/22   Tonny Bollman, MD  fluconazole (DIFLUCAN) 150 MG tablet Take 150 mg by mouth once. 01/21/21   [provider]  glipiZIDE (GLUCOTROL XL) 5 MG 24 hr tablet Take 5 mg by mouth daily.    [provider]  HYDROcodone-acetaminophen (NORCO/VICODIN) 5-325 MG tablet Take 1 tablet by mouth every 8 (eight) hours. 02/11/21   [provider]  icosapent Ethyl (VASCEPA) 1 g capsule Take 2 capsules (2 g  total) by mouth 2 (two) times daily. 11/15/21   Tonny Bollman, MD  inFLIXimab (REMICADE) 100 MG injection Inject 100 mg into the vein every 8 (eight) weeks.    [provider]  lisinopril-hydrochlorothiazide (ZESTORETIC) 20-12.5 MG tablet Take 2 tablets by mouth daily. Please make overdue appt with Dr. Excell Seltzer before anymore refills. 1st attempt 01/23/20   Tonny Bollman, MD  meloxicam (MOBIC) 15 MG tablet Take 1 tablet (15 mg total) by mouth daily. 02/20/21   Felecia Shelling, DPM  metFORMIN (GLUCOPHAGE-XR) 500 MG 24 hr tablet Take 1,000 mg by mouth 2 (two) times daily.    [provider]  methylPREDNISolone (MEDROL DOSEPAK) 4 MG TBPK tablet 6 day dose pack - take as directed 05/28/21   Felecia Shelling, DPM  metoprolol tartrate (LOPRESSOR) 50 MG tablet Take 1 tablet by mouth twice daily 09/01/22   Tonny Bollman, MD  Multiple Vitamin (MULTIVITAMIN WITH MINERALS) TABS tablet Take 1 tablet by mouth daily.    [provider]  omeprazole (PRILOSEC) 40 MG capsule Take 1 capsule by mouth daily. 12/02/20   [provider]  ondansetron (ZOFRAN-ODT) 8 MG disintegrating tablet Take 8 mg by mouth every 8 (eight) hours as needed for nausea or vomiting. 12/03/20   [provider]  potassium chloride (KLOR-CON) 10 MEQ tablet Take 10 mEq by mouth daily.  11/09/19   [provider]    Physical Exam: Vitals:   12/03/22 0630 12/03/22 0719 12/03/22 0830 12/03/22 0914  BP: (!) 139/92 120/79  (!) 141/71  Pulse: (!) 124 (!) 121  (!) 117  Resp: 20 (!) 21  18  Temp: (!) 101.9 F (38.8 C) (!) 102.6 F (39.2 C) (!) 100.4 F (38 C) (!) 101 F (38.3 C)  TempSrc:  Oral Oral Oral  SpO2: 97% 99%  100%  Weight:      Height:       Physical Exam Vitals and nursing note reviewed.  Constitutional:      Appearance: She is obese.  HENT:     Head: Normocephalic and atraumatic.     Nose: Nose normal.     Mouth/Throat:     Mouth: Mucous membranes are moist.  Eyes:      Conjunctiva/sclera: Conjunctivae normal.  Cardiovascular:     Rate and Rhythm: Tachycardia present.  Pulmonary:     Breath sounds: Rhonchi present.     Comments: Tachypneic Abdominal:     General: Abdomen is flat. Bowel sounds are normal.     Palpations: Abdomen is soft.  Musculoskeletal:        General: Normal range of motion.     Cervical back: Normal range of motion and neck supple.  Skin:    General: Skin is warm and dry.  Neurological:     General: No focal deficit present.     Mental Status: She is alert and oriented to person, place, and time.  Psychiatric:  Mood and Affect: Mood normal.        Behavior: Behavior normal.     Data Reviewed: Relevant notes from primary care and specialist visits, past discharge summaries as available in EHR, including Care Everywhere. Prior diagnostic testing as pertinent to current admission diagnoses Updated medications and problem lists for reconciliation ED course, including vitals, labs, imaging, treatment and response to treatment Triage notes, nursing and pharmacy notes and ED provider's notes Notable results as noted in HPI Labs reviewed.  Lactic acid 1.7, sodium 128, potassium 3.4, chloride 94, bicarb 22, glucose 259, BUN 11, creatinine 0.91, calcium 8.7, total protein 8.0, albumin 3.6, AST 22, ALT 25, alkaline phosphatase 68, total bilirubin 1.4, white count 19.1, hemoglobin 11.4, hematocrit 32.6, platelet count 317 Chest x-ray reviewed by me shows new airspace consolidation in the right lower lobe concerning for pneumonia. Followup PA and lateral chest X-ray is recommended in 3-4 weeks following trial of antibiotic therapy to ensure resolution and exclude underlying malignancy. Small right pleural effusion. Twelve-lead EKG reviewed by me shows sinus tachycardia There are no new results to review at this time.  Assessment and Plan: * Sepsis due to pneumonia [facility]) As evidenced by fever with a Tmax of 102.6, tachycardia,  tachypnea, marked leukocytosis and chest x-ray findings that showed a right lower lobe pneumonia Continue aggressive IV fluid resuscitation Treat patient empirically with Rocephin and Zithromax Follow-up results of blood cultures  Hyponatremia Secondary to HCTZ use Expect improvement with IV fluid hydration  Hypokalemia Secondary to HCTZ use Supplement potassium  Uncontrolled type 2 diabetes mellitus with hyperglycemia, without long-term current use of insulin (HCC) Hold all oral hypoglycemic agents Check hemoglobin A1c levels Maintain consistent carbohydrate diet Glycemic control with sliding scale insulin  Crohn's disease of both small and large intestine without complication (HCC) Stable Continue Remicade infusion as an outpatient  Essential hypertension Hold lisinopril and hydrochlorothiazide Continue metoprolol  Coronary artery disease Stable Continue aspirin, metoprolol and atorvastatin  Gastroesophageal reflux disease without esophagitis Continue PPI  TOBACCO ABUSE Smoking cessation has been discussed with patient in detail We will place patient on nicotine transdermal patch  Severe obesity (BMI 35.0-39.9) with comorbidity (HCC) BMI 36.58 Complicates overall prognosis and care      Advance Care Planning:   Code Status: Full Code   Consults: None  Family Communication: Greater than 50% of time was spent discussing patient's condition and plan of care with her at the bedside.  All questions and concerns have been addressed.  She verbalizes understanding and agrees with the plan.  Severity of Illness: The appropriate patient status for this patient is INPATIENT. Inpatient status is judged to be reasonable and necessary in order to provide the required intensity of service to ensure the patient's safety. The patient's presenting symptoms, physical exam findings, and initial radiographic and laboratory data in the context of their chronic comorbidities is felt to  place them at high risk for further clinical deterioration. Furthermore, it is not anticipated that the patient will be medically stable for discharge from the hospital within 2 midnights of admission.   * I certify that at the point of admission it is my clinical judgment that the patient will require inpatient hospital care spanning beyond 2 midnights from the point of admission due to high intensity of service, high risk for further deterioration and high frequency of surveillance required.*  Author: Lucile Shutters, MD 12/03/2022 9:33 AM  For on call review [email or web address].

## 2022-12-03 NOTE — ED Triage Notes (Signed)
Pt arrived POV for c/o right rib pain and right sided neck pain that has subsided. Pt also reports unproductive cough, decrease appetite, fatigue, 1 episode of diarrhea last night and fever 103 at home, took Nyquil at 9pm.  Fever 102.3 and HR 140s. A&O x4.

## 2022-12-03 NOTE — Assessment & Plan Note (Signed)
Smoking cessation has been discussed with patient in detail We will place patient on nicotine transdermal patch

## 2022-12-03 NOTE — ED Provider Notes (Signed)
[facility]-EMERGENCY DEPT [facility] Emergency Department Provider Note MRN:  [id number]  Arrival date & time: 12/03/22     Chief Complaint   Fever   History of Present Illness   [name] is a 47 y.o. year-old female presents to the ED with chief complaint of right sided rib/back pain.  Reports associated cough and fever that started 2 days ago.  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  She denies any abdominal pain.  Denies any dysuria or hematuria.  Denies any other associated symptoms.  History provided by patient.   Review of Systems  Pertinent positive and negative review of systems noted in HPI.    Physical Exam   Vitals:   12/03/22 0617 12/03/22 0630  BP:  (!) 139/92  Pulse:  (!) 124  Resp:    Temp: (!) 101.9 F (38.8 C)   SpO2:  97%    CONSTITUTIONAL:  well-appearing, NAD NEURO:  Alert and oriented x 3, CN 3-12 grossly intact EYES:  eyes equal and reactive ENT/NECK:  Supple, no stridor  CARDIO:  tachycardic, regular rhythm, appears well-perfused  PULM:  No respiratory distress, CTAB GI/GU:  non-distended, no focal abdominal tenderness MSK/SPINE:  No gross deformities, no edema, moves all extremities  SKIN:  no rash, atraumatic   *Additional and/or pertinent findings included in MDM below  Diagnostic and Interventional Summary    EKG Interpretation  Date/Time:  Wednesday Dec 03 2022 04:22:25 EDT Ventricular Rate:  130 PR Interval:  92 QRS Duration: 68 QT Interval:  347 QTC Calculation: 511 R Axis:   29 Text Interpretation: Sinus tachycardia Low voltage, precordial leads Borderline repolarization abnormality Prolonged QT interval Confirmed by Molpus, John ([id number]) on 12/03/2022 4:30:06 AM       Labs Reviewed  COMPREHENSIVE METABOLIC PANEL - Abnormal; Notable for the following components:      Result Value   Sodium 128 (*)    Potassium 3.4 (*)    Chloride 94 (*)    Glucose, Bld 259 (*)    Calcium  8.7 (*)    Total Bilirubin 1.4 (*)    All other components within normal limits  CBC WITH DIFFERENTIAL/PLATELET - Abnormal; Notable for the following components:   WBC 19.1 (*)    RBC 3.51 (*)    Hemoglobin 11.4 (*)    HCT 32.6 (*)    Neutro Abs 13.5 (*)    Monocytes Absolute 2.2 (*)    Abs Immature Granulocytes 0.08 (*)    All other components within normal limits  APTT - Abnormal; Notable for the following components:   aPTT 37 (*)    All other components within normal limits  RESP PANEL BY RT-PCR (RSV, FLU A&B, COVID)  RVPGX2  CULTURE, BLOOD (ROUTINE X 2)  CULTURE, BLOOD (ROUTINE X 2)  LACTIC ACID, PLASMA  PROTIME-INR  HCG, QUANTITATIVE, PREGNANCY  LACTIC ACID, PLASMA  URINALYSIS, ROUTINE W REFLEX MICROSCOPIC    DG Chest Port 1 View  Final Result      Medications  lactated ringers infusion ( Intravenous New Bag/Given 12/03/22 0620)  vancomycin (VANCOREADY) IVPB 1750 mg/350 mL (1,750 mg Intravenous New Bag/Given 12/03/22 0626)  lactated ringers bolus 1,000 mL (0 mLs Intravenous Stopped 12/03/22 0513)    And  lactated ringers bolus 600 mL (0 mLs Intravenous Stopped 12/03/22 0511)  ceFEPIme (MAXIPIME) 2 g in sodium chloride 0.9 % 100 mL IVPB (0 g Intravenous Stopped 12/03/22 0521)  metroNIDAZOLE (FLAGYL) IVPB 500  mg (0 mg Intravenous Stopped 12/03/22 0619)  acetaminophen (TYLENOL) tablet 1,000 mg (1,000 mg Oral Given 12/03/22 0616)     Procedures  /  Critical Care .Critical Care  Performed by: Roxy Horseman, PA-C Authorized by: Roxy Horseman, PA-C   Critical care provider statement:    Critical care time (minutes):  47   Critical care was necessary to treat or prevent imminent or life-threatening deterioration of the following conditions:  Sepsis   Critical care was time spent personally by me on the following activities:  Development of treatment plan with patient or surrogate, discussions with consultants, evaluation of patient's response to treatment, examination of patient,  ordering and review of laboratory studies, ordering and review of radiographic studies, ordering and performing treatments and interventions, pulse oximetry, re-evaluation of patient's condition and review of old charts   ED Course and Medical Decision Making  I have reviewed the triage vital signs, the nursing notes, and pertinent available records from the EMR.  Social Determinants Affecting Complexity of Care: Patient has no clinically significant social determinants affecting this chief complaint..   ED Course:    Medical Decision Making Patient here with fever, rapid heart rate.  Complains of right lowe back pain.  Has been coughing.  Meets SIRS criteria.  Will check imaging and labs.  Will start sepsis bundle.    Amount and/or Complexity of Data Reviewed Labs: ordered.    Details: Leukocytosis to 19 Normal lactic Covid and flu negative Radiology: ordered and independent interpretation performed.    Details: Opacity in RLL ECG/medicine tests: ordered.  Risk OTC drugs. Prescription drug management. Decision regarding hospitalization.     Consultants: I consulted with Hospitalist, Dr. Julian Reil, who is appreciated for admitting.   Treatment and Plan: Patient's exam and diagnostic results are concerning for sepsis and pneumonia.  Feel that patient will need admission to the hospital for further treatment and evaluation.  Patient discussed with attending physician, Dr. Read Drivers, who agrees with plan.  Final Clinical Impressions(s) / ED Diagnoses     ICD-10-CM   1. Sepsis, due to unspecified organism, unspecified whether acute organ dysfunction present (HCC)  A41.9     2. Pneumonia of right lower lobe due to infectious organism  J18.9       ED Discharge Orders     None         Discharge Instructions Discussed with and Provided to Patient:   Discharge Instructions   None      Roxy Horseman, PA-C 12/03/22 [id number]    Paula Libra, MD 12/03/22 [phone]

## 2022-12-03 NOTE — Assessment & Plan Note (Deleted)
Continue aspirin, metoprolol and atorvastatin

## 2022-12-03 NOTE — Progress Notes (Signed)
Elink monitoring for the code sepsis protocol.  

## 2022-12-03 NOTE — Assessment & Plan Note (Signed)
Continue PPI ?

## 2022-12-03 NOTE — Progress Notes (Signed)
Pt temp to 103.3, HR 127, RR  21, BP 136/77 ( map 94), O2 98. Patient has has a total of 4 liquid stools during this shift. Red MEWS. MD notified. See new orders.

## 2022-12-03 NOTE — Assessment & Plan Note (Signed)
Hold lisinopril and hydrochlorothiazide Continue metoprolol

## 2022-12-03 NOTE — Assessment & Plan Note (Signed)
Stable Continue Remicade infusion as an outpatient

## 2022-12-04 DIAGNOSIS — T502X5A Adverse effect of carbonic-anhydrase inhibitors, benzothiadiazides and other diuretics, initial encounter: Secondary | ICD-10-CM | POA: Diagnosis present

## 2022-12-04 DIAGNOSIS — J189 Pneumonia, unspecified organism: Secondary | ICD-10-CM | POA: Diagnosis present

## 2022-12-04 DIAGNOSIS — E785 Hyperlipidemia, unspecified: Secondary | ICD-10-CM | POA: Diagnosis present

## 2022-12-04 DIAGNOSIS — I10 Essential (primary) hypertension: Secondary | ICD-10-CM | POA: Diagnosis present

## 2022-12-04 DIAGNOSIS — K5080 Crohn's disease of both small and large intestine without complications: Secondary | ICD-10-CM | POA: Diagnosis present

## 2022-12-04 DIAGNOSIS — Z1152 Encounter for screening for COVID-19: Secondary | ICD-10-CM | POA: Diagnosis not present

## 2022-12-04 DIAGNOSIS — Z888 Allergy status to other drugs, medicaments and biological substances status: Secondary | ICD-10-CM | POA: Diagnosis not present

## 2022-12-04 DIAGNOSIS — Z6836 Body mass index (BMI) 36.0-36.9, adult: Secondary | ICD-10-CM | POA: Diagnosis not present

## 2022-12-04 DIAGNOSIS — K219 Gastro-esophageal reflux disease without esophagitis: Secondary | ICD-10-CM | POA: Diagnosis present

## 2022-12-04 DIAGNOSIS — Z7984 Long term (current) use of oral hypoglycemic drugs: Secondary | ICD-10-CM | POA: Diagnosis not present

## 2022-12-04 DIAGNOSIS — I252 Old myocardial infarction: Secondary | ICD-10-CM | POA: Diagnosis not present

## 2022-12-04 DIAGNOSIS — E876 Hypokalemia: Secondary | ICD-10-CM | POA: Diagnosis present

## 2022-12-04 DIAGNOSIS — D638 Anemia in other chronic diseases classified elsewhere: Secondary | ICD-10-CM | POA: Diagnosis present

## 2022-12-04 DIAGNOSIS — I2510 Atherosclerotic heart disease of native coronary artery without angina pectoris: Secondary | ICD-10-CM | POA: Diagnosis present

## 2022-12-04 DIAGNOSIS — E860 Dehydration: Secondary | ICD-10-CM | POA: Diagnosis present

## 2022-12-04 DIAGNOSIS — E1165 Type 2 diabetes mellitus with hyperglycemia: Secondary | ICD-10-CM | POA: Diagnosis present

## 2022-12-04 DIAGNOSIS — J918 Pleural effusion in other conditions classified elsewhere: Secondary | ICD-10-CM | POA: Diagnosis present

## 2022-12-04 DIAGNOSIS — E871 Hypo-osmolality and hyponatremia: Secondary | ICD-10-CM | POA: Diagnosis present

## 2022-12-04 DIAGNOSIS — Z79899 Other long term (current) drug therapy: Secondary | ICD-10-CM | POA: Diagnosis not present

## 2022-12-04 DIAGNOSIS — Z823 Family history of stroke: Secondary | ICD-10-CM | POA: Diagnosis not present

## 2022-12-04 DIAGNOSIS — Z8249 Family history of ischemic heart disease and other diseases of the circulatory system: Secondary | ICD-10-CM | POA: Diagnosis not present

## 2022-12-04 DIAGNOSIS — Z885 Allergy status to narcotic agent status: Secondary | ICD-10-CM | POA: Diagnosis not present

## 2022-12-04 DIAGNOSIS — A419 Sepsis, unspecified organism: Secondary | ICD-10-CM | POA: Diagnosis present

## 2022-12-04 LAB — BASIC METABOLIC PANEL
Anion gap: 12 (ref 5–15)
BUN: 12 mg/dL (ref 6–20)
CO2: 22 mmol/L (ref 22–32)
Calcium: 8.4 mg/dL — ABNORMAL LOW (ref 8.9–10.3)
Chloride: 103 mmol/L (ref 98–111)
Creatinine, Ser: 0.75 mg/dL (ref 0.44–1.00)
GFR, Estimated: >60 mL/min (ref >60)
Glucose, Bld: 129 mg/dL — ABNORMAL HIGH (ref 70–99)
Potassium: 3.2 mmol/L — ABNORMAL LOW (ref 3.5–5.1)
Sodium: 137 mmol/L (ref 135–145)

## 2022-12-04 LAB — GASTROINTESTINAL PANEL BY PCR, STOOL (REPLACES STOOL CULTURE)

## 2022-12-04 LAB — GLUCOSE, CAPILLARY
Glucose-Capillary: 141 mg/dL — ABNORMAL HIGH (ref 70–99)
Glucose-Capillary: 182 mg/dL — ABNORMAL HIGH (ref 70–99)
Glucose-Capillary: 224 mg/dL — ABNORMAL HIGH (ref 70–99)
Glucose-Capillary: 154 mg/dL — ABNORMAL HIGH (ref 70–99)

## 2022-12-04 LAB — CBC
HCT: 27.7 % — ABNORMAL LOW (ref 36.0–46.0)
Hemoglobin: 9.4 g/dL — ABNORMAL LOW (ref 12.0–15.0)
MCH: 31.6 pg (ref 26.0–34.0)
MCHC: 33.9 g/dL (ref 30.0–36.0)
MCV: 93.3 fL (ref 80.0–100.0)
Platelets: 285 10*3/uL (ref 150–400)
RBC: 2.97 MIL/uL — ABNORMAL LOW (ref 3.87–5.11)
RDW: 12 % (ref 11.5–15.5)
WBC: 10.8 10*3/uL — ABNORMAL HIGH (ref 4.0–10.5)
nRBC: 0.2 % (ref 0.0–0.2)

## 2022-12-04 LAB — PROTIME-INR
INR: 1.1 (ref 0.8–1.2)
Prothrombin Time: 14.4 seconds (ref 11.4–15.2)

## 2022-12-04 LAB — CULTURE, BLOOD (ROUTINE X 2): Culture: NO GROWTH

## 2022-12-04 LAB — PROCALCITONIN: Procalcitonin: 1.73 ng/mL

## 2022-12-04 MED ORDER — IPRATROPIUM-ALBUTEROL 0.5-2.5 (3) MG/3ML IN SOLN
3.0000 mL | Freq: Four times a day (QID) | RESPIRATORY_TRACT | Status: DC
  Administered 2022-12-04: 3 mL via RESPIRATORY_TRACT
  Filled 2022-12-04: qty 3

## 2022-12-04 MED ORDER — IPRATROPIUM-ALBUTEROL 0.5-2.5 (3) MG/3ML IN SOLN
3.0000 mL | Freq: Two times a day (BID) | RESPIRATORY_TRACT | Status: DC
  Administered 2022-12-04 – 2022-12-05 (×3): 3 mL via RESPIRATORY_TRACT
  Filled 2022-12-04 (×3): qty 3

## 2022-12-04 MED ORDER — LACTATED RINGERS IV SOLN: INTRAVENOUS | Status: AC

## 2022-12-04 MED ORDER — BUDESONIDE 0.25 MG/2ML IN SUSP
0.2500 mg | Freq: Two times a day (BID) | RESPIRATORY_TRACT | Status: DC
  Administered 2022-12-04 – 2022-12-07 (×7): 0.25 mg via RESPIRATORY_TRACT
  Filled 2022-12-04 (×7): qty 2

## 2022-12-04 MED ORDER — HYDRALAZINE HCL 20 MG/ML IJ SOLN: 10.0000 mg | Freq: Four times a day (QID) | INTRAMUSCULAR | Status: DC | PRN

## 2022-12-04 MED ORDER — GUAIFENESIN ER 600 MG PO TB12
1200.0000 mg | ORAL_TABLET | Freq: Two times a day (BID) | ORAL | Status: DC
  Administered 2022-12-04 – 2022-12-07 (×7): 1200 mg via ORAL
  Filled 2022-12-04 (×7): qty 2

## 2022-12-04 MED ORDER — LACTATED RINGERS IV SOLN: INTRAVENOUS | Status: DC

## 2022-12-04 MED ORDER — LORAZEPAM 2 MG/ML IJ SOLN
0.2500 mg | Freq: Once | INTRAMUSCULAR | Status: AC
  Administered 2022-12-04: 0.25 mg via INTRAVENOUS
  Filled 2022-12-04: qty 1

## 2022-12-04 MED ORDER — ACETAMINOPHEN 325 MG PO TABS
650.0000 mg | ORAL_TABLET | ORAL | Status: DC | PRN
  Administered 2022-12-04 – 2022-12-07 (×11): 650 mg via ORAL
  Filled 2022-12-04 (×11): qty 2

## 2022-12-04 MED ORDER — LISINOPRIL 20 MG PO TABS
20.0000 mg | ORAL_TABLET | Freq: Every day | ORAL | Status: DC
  Administered 2022-12-04 – 2022-12-07 (×4): 20 mg via ORAL
  Filled 2022-12-04 (×3): qty 1
  Filled 2022-12-04: qty 2

## 2022-12-04 MED ORDER — IBUPROFEN 200 MG PO TABS
600.0000 mg | ORAL_TABLET | Freq: Four times a day (QID) | ORAL | Status: AC | PRN
  Administered 2022-12-04: 600 mg via ORAL
  Filled 2022-12-04: qty 3

## 2022-12-04 MED ORDER — POTASSIUM CHLORIDE CRYS ER 20 MEQ PO TBCR
20.0000 meq | EXTENDED_RELEASE_TABLET | Freq: Once | ORAL | Status: AC
  Administered 2022-12-04: 20 meq via ORAL
  Filled 2022-12-04: qty 1

## 2022-12-04 MED ORDER — POTASSIUM CHLORIDE CRYS ER 20 MEQ PO TBCR
40.0000 meq | EXTENDED_RELEASE_TABLET | Freq: Once | ORAL | Status: AC
  Administered 2022-12-04: 40 meq via ORAL
  Filled 2022-12-04: qty 2

## 2022-12-04 NOTE — Progress Notes (Signed)
Phlebotomy unable to draw labs this morning, they tried five times and was unsuccessful. MD Regalado notified.

## 2022-12-04 NOTE — Progress Notes (Signed)
PROGRESS NOTE    [name]  ZOX:[id number] DOB: [birth year] DOA: 12/03/2022 PCP: Medicine, [facility]   Brief Narrative: 47 with past medical history significant for CAD, obesity BMI 36, hypertension, dyslipidemia, Crohn's disease, GERD who presents to the ED with complaining of chest wall pain, right lower ribs associated with cough and fever.  Chest x-ray showed new airspace consolidation in the right lower lobe concerning for pneumonia, she was tachycardic tachypneic febrile with temperature 102.  Patient has been admitted for pneumonia.   Assessment & Plan:   Principal Problem:   Sepsis due to pneumonia [facility]) Active Problems:   Hyponatremia   Hypokalemia   Uncontrolled type 2 diabetes mellitus with hyperglycemia, without long-term current use of insulin (HCC)   Severe obesity (BMI 35.0-39.9) with comorbidity (HCC)   TOBACCO ABUSE   Gastroesophageal reflux disease without esophagitis   Coronary artery disease   Essential hypertension   Crohn's disease of both small and large intestine without complication (HCC)   1-Sepsis  secondary to pneumonia: -Patient presents with fever, tachycardia, tachypnea, leukocytosis, chest x-ray showed right lower lobe pneumonia. -Continue with IV ceftriaxone and azithromycin -Continue with IV fluids and follow blood cultures: no growth to date.  -BL wheezing: start Pulmicort, Duo-neb.   2-Hyponatremia: In the setting of hydrochlorothiazide and dehydration. Continue with IV fluids Unable to obtain labs this morning, asked nurse for the lab to try it again  3-Hypokalemia: Replace.  Awaiting B med  4-uncontrolled type 2 diabetes with hyperglycemia Hold oral hypoglycemic agents.  Continue with a sliding scale insulin  Crohn's disease small and large bowel: Medicaid infusion as an outpatient Denies abdominal pain.  She is having diarrhea. Monitor for now  Hypertension: Continue to hold lisinopril and  hydrochlorothiazide, continue with metoprolol  CAD: Continue with aspirin metoprolol and statins  GERD: Continue with PPI  Tobacco abuse: Counseling.   Diarrhea; suspect related to acute illness, denies bloody stool or abdominal pain . C diff negative. GI pathogen pending.     Estimated body mass index is 35 kg/m as calculated from the following:   Height as of this encounter: 5\' 2"  (1.575 m).   Weight as of this encounter: 86.8 kg.   DVT prophylaxis: Lovenox Code Status: Full code Family Communication:Care discussed with patient and fiance who was at bedside.  Disposition Plan:  Status is: Observation The patient remains OBS appropriate and will d/c before 2 midnights.    Consultants:  None  Procedures:  None  Antimicrobials:  Ceftriaxone, Azithro  Subjective: She report diarrhea, since she got sick. Denies abdominal pain. She is feeling better today. Report cough, non productive,. Pain better.   Objective: Vitals:   12/04/22 0400 12/04/22 0410 12/04/22 0500 12/04/22 0600  BP: 134/85  (!) 148/85 117/73  Pulse: 100  93 96  Resp: (!) 22  (!) 21 (!) 22  Temp:  98.3 F (36.8 C)    TempSrc:  Oral    SpO2: 96%  97% 91%  Weight:      Height:        Intake/Output Summary (Last 24 hours) at 12/04/2022 0813 Last data filed at 12/04/2022 0347 Gross per 24 hour  Intake 2969.96 ml  Output 1100 ml  Net 1869.96 ml   Filed Weights   12/03/22 0406 12/03/22 1830  Weight: 90.7 kg 86.8 kg    Examination:  General exam: Appears calm and comfortable  Respiratory system: BL ronchus and Wheezing.  Respiratory effort normal. Cardiovascular system: S1 & S2  heard, RRR. No JVD, murmurs, rubs, gallops or clicks. No pedal edema. Gastrointestinal system: Abdomen is nondistended, soft and nontender. No organomegaly or masses felt. Normal bowel sounds heard. Central nervous system: Alert and oriented. No focal neurological deficits. Extremities: Symmetric 5 x 5 power.    Data  Reviewed: I have personally reviewed following labs and imaging studies  CBC: Recent Labs  Lab 12/03/22 0434  WBC 19.1*  NEUTROABS 13.5*  HGB 11.4*  HCT 32.6*  MCV 92.9  PLT 317   Basic Metabolic Panel: Recent Labs  Lab 12/03/22 0434  NA 128*  K 3.4*  CL 94*  CO2 22  GLUCOSE 259*  BUN 11  CREATININE 0.91  CALCIUM 8.7*   GFR: Estimated Creatinine Clearance: 78.2 mL/min (by C-G formula based on SCr of 0.91 mg/dL). Liver Function Tests: Recent Labs  Lab 12/03/22 0434  AST 22  ALT 25  ALKPHOS 68  BILITOT 1.4*  PROT 8.0  ALBUMIN 3.6   No results for input(s): "LIPASE", "AMYLASE" in the last 168 hours. No results for input(s): "AMMONIA" in the last 168 hours. Coagulation Profile: Recent Labs  Lab 12/03/22 0434  INR 1.2   Cardiac Enzymes: No results for input(s): "CKTOTAL", "CKMB", "CKMBINDEX", "TROPONINI" in the last 168 hours. BNP (last 3 results) No results for input(s): "PROBNP" in the last 8760 hours. HbA1C: Recent Labs    12/03/22 1046  HGBA1C 7.6*   CBG: Recent Labs  Lab 12/03/22 0954 12/03/22 1200 12/03/22 1730 12/03/22 2150  GLUCAP 175* 213* 182* 162*   Lipid Profile: No results for input(s): "CHOL", "HDL", "LDLCALC", "TRIG", "CHOLHDL", "LDLDIRECT" in the last 72 hours. Thyroid Function Tests: No results for input(s): "TSH", "T4TOTAL", "FREET4", "T3FREE", "THYROIDAB" in the last 72 hours. Anemia Panel: No results for input(s): "VITAMINB12", "FOLATE", "FERRITIN", "TIBC", "IRON", "RETICCTPCT" in the last 72 hours. Sepsis Labs: Recent Labs  Lab 12/03/22 0434 12/03/22 1610  LATICACIDVEN 1.5 1.7    Recent Results (from the past 240 hour(s))  Resp panel by RT-PCR (RSV, Flu A&B, Covid) Anterior Nasal Swab     Status: None   Collection Time: 12/03/22  5:22 AM   Specimen: Anterior Nasal Swab  Result Value Ref Range Status   SARS Coronavirus 2 by RT PCR NEGATIVE NEGATIVE Final    Comment: (NOTE) SARS-CoV-2 target nucleic acids are NOT  DETECTED.  The SARS-CoV-2 RNA is generally detectable in upper respiratory specimens during the acute phase of infection. The lowest concentration of SARS-CoV-2 viral copies this assay can detect is 138 copies/mL. A negative result does not preclude SARS-Cov-2 infection and should not be used as the sole basis for treatment or other patient management decisions. A negative result may occur with  improper specimen collection/handling, submission of specimen other than nasopharyngeal swab, presence of viral mutation(s) within the areas targeted by this assay, and inadequate number of viral copies(<138 copies/mL). A negative result must be combined with clinical observations, patient history, and epidemiological information. The expected result is Negative.  Fact Sheet for Patients:  BloggerCourse.com  Fact Sheet for Healthcare Providers:  SeriousBroker.it  This test is no t yet approved or cleared by the [place] FDA and  has been authorized for detection and/or diagnosis of SARS-CoV-2 by FDA under an Emergency Use Authorization (EUA). This EUA will remain  in effect (meaning this test can be used) for the duration of the COVID-19 declaration under Section 564(b)(1) of the Act, 21 U.S.C.section 360bbb-3(b)(1), unless the authorization is terminated  or revoked sooner.  Influenza A by PCR NEGATIVE NEGATIVE Final   Influenza B by PCR NEGATIVE NEGATIVE Final    Comment: (NOTE) The Xpert Xpress SARS-CoV-2/FLU/RSV plus assay is intended as an aid in the diagnosis of influenza from Nasopharyngeal swab specimens and should not be used as a sole basis for treatment. Nasal washings and aspirates are unacceptable for Xpert Xpress SARS-CoV-2/FLU/RSV testing.  Fact Sheet for Patients: BloggerCourse.com  Fact Sheet for Healthcare Providers: SeriousBroker.it  This test is not yet  approved or cleared by the [place] FDA and has been authorized for detection and/or diagnosis of SARS-CoV-2 by FDA under an Emergency Use Authorization (EUA). This EUA will remain in effect (meaning this test can be used) for the duration of the COVID-19 declaration under Section 564(b)(1) of the Act, 21 U.S.C. section 360bbb-3(b)(1), unless the authorization is terminated or revoked.     Resp Syncytial Virus by PCR NEGATIVE NEGATIVE Final    Comment: (NOTE) Fact Sheet for Patients: BloggerCourse.com  Fact Sheet for Healthcare Providers: SeriousBroker.it  This test is not yet approved or cleared by the [place] FDA and has been authorized for detection and/or diagnosis of SARS-CoV-2 by FDA under an Emergency Use Authorization (EUA). This EUA will remain in effect (meaning this test can be used) for the duration of the COVID-19 declaration under Section 564(b)(1) of the Act, 21 U.S.C. section 360bbb-3(b)(1), unless the authorization is terminated or revoked.  Performed at [facility], [address] [place], [place] [address]   C Difficile Quick Screen w PCR reflex     Status: None   Collection Time: 12/03/22  5:49 PM   Specimen: STOOL  Result Value Ref Range Status   C Diff antigen NEGATIVE NEGATIVE Final   C Diff toxin NEGATIVE NEGATIVE Final   C Diff interpretation No C. difficile detected.  Final    Comment: Performed at [facility], [address], [place], [place] [address]  MRSA Next Gen by PCR, Nasal     Status: None   Collection Time: 12/03/22  6:44 PM   Specimen: Nasal Mucosa; Nasal Swab  Result Value Ref Range Status   MRSA by PCR Next Gen NOT DETECTED NOT DETECTED Final    Comment:        The GeneXpert MRSA Assay (FDA approved for NASAL specimens only), is one component of a comprehensive MRSA colonization surveillance program. It is not intended to diagnose  MRSA infection nor to guide or monitor treatment for MRSA infections. Performed at [facility], [address] [place], [place] [address]          Radiology Studies: [facility] Chest [place] 1 View  Result Date: 12/03/2022 CLINICAL DATA:  47 year old female with fever and possible sepsis. EXAM: PORTABLE CHEST 1 VIEW COMPARISON:  Chest x-ray 04/23/2017. FINDINGS: Elevation of the right hemidiaphragm. New consolidation in the base of the right lung partially obscuring the right hemidiaphragm. Left lung is clear. Probable small right pleural effusion. No definite left pleural effusion. No evidence of pulmonary edema. Heart size is normal. Upper mediastinal contours are within normal limits. IMPRESSION: 1. New airspace consolidation in the right lower lobe concerning for pneumonia. Followup PA and lateral chest X-ray is recommended in 3-4 weeks following trial of antibiotic therapy to ensure resolution and exclude underlying malignancy. 2. Small right pleural effusion. Electronically Signed   By: Trudie Reed M.D.   On: 12/03/2022 06:01        Scheduled Meds:  aspirin EC  81  mg Oral Daily   atorvastatin  20 mg Oral Daily   Chlorhexidine Gluconate Cloth  6 each Topical Daily   enoxaparin (LOVENOX) injection  40 mg Subcutaneous Q24H   insulin aspart  0-15 Units Subcutaneous TID WC   metoprolol tartrate  25 mg Oral BID   multivitamin with minerals  1 tablet Oral Daily   nicotine  7 mg Transdermal Daily   pantoprazole  40 mg Oral Daily   Continuous Infusions:  azithromycin Stopped (12/03/22 1205)   cefTRIAXone (ROCEPHIN)  IV 2 g (12/03/22 1027)   lactated ringers 125 mL/hr at 12/04/22 0347     LOS: 0 days    Time spent: 35 minutes    Jonie Burdell A Zaeden Lastinger, MD [facility]   If 7PM-7AM, please contact night-coverage [email or web address]  12/04/2022, 8:13 AM

## 2022-12-05 DIAGNOSIS — A419 Sepsis, unspecified organism: Secondary | ICD-10-CM | POA: Diagnosis not present

## 2022-12-05 DIAGNOSIS — J189 Pneumonia, unspecified organism: Secondary | ICD-10-CM | POA: Diagnosis not present

## 2022-12-05 LAB — CBC
HCT: 24.3 % — ABNORMAL LOW (ref 36.0–46.0)
Hemoglobin: 8.1 g/dL — ABNORMAL LOW (ref 12.0–15.0)
MCH: 31.8 pg (ref 26.0–34.0)
MCHC: 33.3 g/dL (ref 30.0–36.0)
MCV: 95.3 fL (ref 80.0–100.0)
Platelets: 296 10*3/uL (ref 150–400)
RBC: 2.55 MIL/uL — ABNORMAL LOW (ref 3.87–5.11)
RDW: 12.3 % (ref 11.5–15.5)
WBC: 8.5 10*3/uL (ref 4.0–10.5)
nRBC: 0.2 % (ref 0.0–0.2)

## 2022-12-05 LAB — BASIC METABOLIC PANEL
Anion gap: 8 (ref 5–15)
BUN: 6 mg/dL (ref 6–20)
CO2: 21 mmol/L — ABNORMAL LOW (ref 22–32)
Calcium: 8.1 mg/dL — ABNORMAL LOW (ref 8.9–10.3)
Chloride: 105 mmol/L (ref 98–111)
Creatinine, Ser: 0.66 mg/dL (ref 0.44–1.00)
GFR, Estimated: >60 mL/min (ref >60)
Glucose, Bld: 242 mg/dL — ABNORMAL HIGH (ref 70–99)
Potassium: 3.2 mmol/L — ABNORMAL LOW (ref 3.5–5.1)
Sodium: 134 mmol/L — ABNORMAL LOW (ref 135–145)

## 2022-12-05 LAB — GLUCOSE, CAPILLARY
Glucose-Capillary: 226 mg/dL — ABNORMAL HIGH (ref 70–99)
Glucose-Capillary: 225 mg/dL — ABNORMAL HIGH (ref 70–99)
Glucose-Capillary: 124 mg/dL — ABNORMAL HIGH (ref 70–99)
Glucose-Capillary: 177 mg/dL — ABNORMAL HIGH (ref 70–99)

## 2022-12-05 LAB — CULTURE, BLOOD (ROUTINE X 2)

## 2022-12-05 MED ORDER — INSULIN GLARGINE-YFGN 100 UNIT/ML ~~LOC~~ SOLN
20.0000 [IU] | Freq: Every day | SUBCUTANEOUS | Status: DC
  Administered 2022-12-05 – 2022-12-06 (×2): 20 [IU] via SUBCUTANEOUS
  Filled 2022-12-05 (×2): qty 0.2

## 2022-12-05 MED ORDER — IBUPROFEN 200 MG PO TABS
600.0000 mg | ORAL_TABLET | Freq: Once | ORAL | Status: AC
  Administered 2022-12-05: 600 mg via ORAL
  Filled 2022-12-05: qty 3

## 2022-12-05 MED ORDER — POTASSIUM CHLORIDE CRYS ER 20 MEQ PO TBCR
40.0000 meq | EXTENDED_RELEASE_TABLET | ORAL | Status: AC
  Administered 2022-12-05 (×2): 40 meq via ORAL
  Filled 2022-12-05 (×2): qty 2

## 2022-12-05 NOTE — Inpatient Diabetes Management (Signed)
Inpatient Diabetes Program Recommendations  AACE/ADA: New Consensus Statement on Inpatient Glycemic Control (2015)  Target Ranges:  Prepandial:   less than 140 mg/dL      Peak postprandial:   less than 180 mg/dL (1-2 hours)      Critically ill patients:  140 - 180 mg/dL   Lab Results  Component Value Date   GLUCAP 225 (H) 12/05/2022   HGBA1C 7.6 (H) 12/03/2022    Review of Glycemic Control  Diabetes history: DM2 Outpatient Diabetes medications: Lantus 23 units QD, metformin 1000 mg BID, glipizide 5 mg QAM Current orders for Inpatient glycemic control: Semglee 20 QHS, Novolog 0-15 TID with meals  HgbA1C - 7.6%  Inpatient Diabetes Program Recommendations:    Consider adding Novolog 2 units TID with meals if eating > 50%  Spoke with pt at bedside regarding her diabetes and HgbA1C of 7.6%. Pt states she's been trying to eat healthier and takes DM meds as prescribed. Monitors blood sugars 1x/day in am and encouraged her to check blood sugars at different times, maybe before dinner of HS. Pt willing to do this. Sees MD on regular basis. Continues to strive for HgbA1C < 7%. Denies hypoglycemia.   To f/u with PCP for diabetes management.  Thank you. Ailene Ards, RD, LDN, CDCES Inpatient Diabetes Coordinator [phone]

## 2022-12-05 NOTE — Progress Notes (Signed)
  Transition of Care (TOC) Screening Note   Patient Details  Name: [name] Date of Birth: [birth year]   Transition of Care [facility]) CM/SW Contact:    Lanier Clam, RN Phone Number: 12/05/2022, 10:33 AM    Transition of Care Department [facility]) has reviewed patient and no TOC needs have been identified at this time. We will continue to monitor patient advancement through interdisciplinary progression rounds. If new patient transition needs arise, please place a TOC consult.

## 2022-12-05 NOTE — Progress Notes (Signed)
PROGRESS NOTE    [name]  ZOX:[id number] DOB: [birth year] DOA: 12/03/2022 PCP: Medicine, [facility]   Brief Narrative: 25 with past medical history significant for CAD, obesity BMI 36, hypertension, dyslipidemia, Crohn's disease, GERD who presents to the ED with complaining of chest wall pain, right lower ribs associated with cough and fever.  Chest x-ray showed new airspace consolidation in the right lower lobe concerning for pneumonia, she was tachycardic tachypneic febrile with temperature 102.  Patient has been admitted for pneumonia.   Assessment & Plan:   Principal Problem:   Sepsis due to pneumonia [facility]) Active Problems:   Hyponatremia   Hypokalemia   Uncontrolled type 2 diabetes mellitus with hyperglycemia, without long-term current use of insulin (HCC)   Severe obesity (BMI 35.0-39.9) with comorbidity (HCC)   TOBACCO ABUSE   Gastroesophageal reflux disease without esophagitis   Coronary artery disease   Essential hypertension   Crohn's disease of both small and large intestine without complication (HCC)   1-Sepsis  secondary to pneumonia: -Patient presents with fever, tachycardia, tachypnea, leukocytosis, chest x-ray showed right lower lobe pneumonia. -Continue with IV ceftriaxone and azithromycin -Continue with IV fluids and follow blood cultures: no growth to date.  -Continue with  Pulmicort, Duo-neb.  Plan to continue current management. Still was having fevers yesterday night.   2-Hyponatremia: In the setting of hydrochlorothiazide and dehydration. Continue with IV fluids Improved.   3-Hypokalemia: Replete orally.   4-uncontrolled type 2 diabetes with hyperglycemia Hold oral hypoglycemic agents.  Continue with a sliding scale insulin Resume Seemgle.   Anemia:  Hb trending down. Prior hb 5 years ago.  Check anemia panel.  No prior hb  Denies melena, hematochezia, heavy menstrual period.    Crohn's disease small and large  bowel: Medicaid infusion as an outpatient Denies abdominal pain.    Hypertension: Continue to hold lisinopril and hydrochlorothiazide, continue with metoprolol  CAD: Continue with aspirin metoprolol and statins  GERD: Continue with PPI  Tobacco abuse: Counseling.   Diarrhea; suspect related to acute illness, denies bloody stool or abdominal pain C diff negative. GI pathogen Negative.     Estimated body mass index is 35 kg/m as calculated from the following:   Height as of this encounter: 5\' 2"  (1.575 m).   Weight as of this encounter: 86.8 kg.   DVT prophylaxis: Lovenox Code Status: Full code Family Communication:Care discussed with patient and fiance who was at bedside.  Disposition Plan:  Status is: Observation The patient remains OBS appropriate and will d/c before 2 midnights.    Consultants:  None  Procedures:  None  Antimicrobials:  Ceftriaxone, Azithro  Subjective: Still having cough, nebulizer helps.   Objective: Vitals:   12/05/22 0249 12/05/22 0351 12/05/22 0456 12/05/22 0855  BP: (!) 153/95 (!) 150/92 (!) 149/90 (!) 152/95  Pulse: (!) 112 (!) 109 (!) 104 (!) 108  Resp: 20 20 18 20   Temp: (!) 101.8 F (38.8 C) (!) 100.5 F (38.1 C) 99.6 F (37.6 C) 99.9 F (37.7 C)  TempSrc: Oral Oral Oral Oral  SpO2: 99% 97% 96% 99%  Weight:      Height:        Intake/Output Summary (Last 24 hours) at 12/05/2022 1541 Last data filed at 12/05/2022 0952 Gross per 24 hour  Intake 1448.3 ml  Output --  Net 1448.3 ml    Filed Weights   12/03/22 0406 12/03/22 1830  Weight: 90.7 kg 86.8 kg    Examination:  General exam:  NAD Respiratory system: Normal Respiratory effort, BL [place]. No wheezing.  Cardiovascular system: S 1, S 2 RRR Gastrointestinal system: BS present, soft, nt Central nervous system: Non focal.  Extremities:no edema    Data Reviewed: I have personally reviewed following labs and imaging studies  CBC: Recent Labs  Lab  12/03/22 0434 12/04/22 1509 12/05/22 0830  WBC 19.1* 10.8* 8.5  NEUTROABS 13.5*  --   --   HGB 11.4* 9.4* 8.1*  HCT 32.6* 27.7* 24.3*  MCV 92.9 93.3 95.3  PLT 317 285 296    Basic Metabolic Panel: Recent Labs  Lab 12/03/22 0434 12/04/22 1421 12/05/22 0830  NA 128* 137 134*  K 3.4* 3.2* 3.2*  CL 94* 103 105  CO2 22 22 21*  GLUCOSE 259* 129* 242*  BUN 11 12 6   CREATININE 0.91 0.75 0.66  CALCIUM 8.7* 8.4* 8.1*    GFR: Estimated Creatinine Clearance: 88.9 mL/min (by C-G formula based on SCr of 0.66 mg/dL). Liver Function Tests: Recent Labs  Lab 12/03/22 0434  AST 22  ALT 25  ALKPHOS 68  BILITOT 1.4*  PROT 8.0  ALBUMIN 3.6    No results for input(s): "LIPASE", "AMYLASE" in the last 168 hours. No results for input(s): "AMMONIA" in the last 168 hours. Coagulation Profile: Recent Labs  Lab 12/03/22 0434 12/04/22 1421  INR 1.2 1.1    Cardiac Enzymes: No results for input(s): "CKTOTAL", "CKMB", "CKMBINDEX", "TROPONINI" in the last 168 hours. BNP (last 3 results) No results for input(s): "PROBNP" in the last 8760 hours. HbA1C: Recent Labs    12/03/22 1046  HGBA1C 7.6*    CBG: Recent Labs  Lab 12/04/22 1143 12/04/22 1716 12/04/22 2116 12/05/22 0736 12/05/22 1143  GLUCAP 182* 224* 154* 226* 225*    Lipid Profile: No results for input(s): "CHOL", "HDL", "LDLCALC", "TRIG", "CHOLHDL", "LDLDIRECT" in the last 72 hours. Thyroid Function Tests: No results for input(s): "TSH", "T4TOTAL", "FREET4", "T3FREE", "THYROIDAB" in the last 72 hours. Anemia Panel: No results for input(s): "VITAMINB12", "FOLATE", "FERRITIN", "TIBC", "IRON", "RETICCTPCT" in the last 72 hours. Sepsis Labs: Recent Labs  Lab 12/03/22 0434 12/03/22 0611 12/04/22 1421  PROCALCITON  --   --  1.73  LATICACIDVEN 1.5 1.7  --      Recent Results (from the past 240 hour(s))  Blood Culture (routine x 2)     Status: None (Preliminary result)   Collection Time: 12/03/22  4:34 AM    Specimen: BLOOD  Result Value Ref Range Status   Specimen Description   Final    BLOOD RIGHT ANTECUBITAL Performed at [facility], [address], [place], [place] [address]    Special Requests   Final    BOTTLES DRAWN AEROBIC AND ANAEROBIC Blood Culture results may not be optimal due to an inadequate volume of blood received in culture bottles Performed at [facility], [address], [place], [place] [address]    Culture   Final    NO GROWTH 2 DAYS Performed at [facility], [address], [place], [place] [address]    Report Status PENDING  Incomplete  Blood Culture (routine x 2)     Status: None (Preliminary result)   Collection Time: 12/03/22  4:50 AM   Specimen: BLOOD  Result Value Ref Range Status   Specimen Description   Final    BLOOD BLOOD RIGHT WRIST Performed at [facility], [address], [place], [place] [address]    Special Requests  Final    BOTTLES DRAWN AEROBIC AND ANAEROBIC Blood Culture adequate volume Performed at [facility], [address] [place], [place] [address]    Culture   Final    NO GROWTH 2 DAYS Performed at [facility], [address] [place], [place] [address]    Report Status PENDING  Incomplete  Resp panel by RT-PCR (RSV, Flu A&B, Covid) Anterior Nasal Swab     Status: None   Collection Time: 12/03/22  5:22 AM   Specimen: Anterior Nasal Swab  Result Value Ref Range Status   SARS Coronavirus 2 by RT PCR NEGATIVE NEGATIVE Final    Comment: (NOTE) SARS-CoV-2 target nucleic acids are NOT DETECTED.  The SARS-CoV-2 RNA is generally detectable in upper respiratory specimens during the acute phase of infection. The lowest concentration of SARS-CoV-2 viral copies this assay can detect is 138 copies/mL. A negative result does not preclude SARS-Cov-2 infection and should not be used as the sole basis for treatment or other patient management  decisions. A negative result may occur with  improper specimen collection/handling, submission of specimen other than nasopharyngeal swab, presence of viral mutation(s) within the areas targeted by this assay, and inadequate number of viral copies(<138 copies/mL). A negative result must be combined with clinical observations, patient history, and epidemiological information. The expected result is Negative.  Fact Sheet for Patients:  BloggerCourse.com  Fact Sheet for Healthcare Providers:  SeriousBroker.it  This test is no t yet approved or cleared by the [place] FDA and  has been authorized for detection and/or diagnosis of SARS-CoV-2 by FDA under an Emergency Use Authorization (EUA). This EUA will remain  in effect (meaning this test can be used) for the duration of the COVID-19 declaration under Section 564(b)(1) of the Act, 21 U.S.C.section 360bbb-3(b)(1), unless the authorization is terminated  or revoked sooner.       Influenza A by PCR NEGATIVE NEGATIVE Final   Influenza B by PCR NEGATIVE NEGATIVE Final    Comment: (NOTE) The Xpert Xpress SARS-CoV-2/FLU/RSV plus assay is intended as an aid in the diagnosis of influenza from Nasopharyngeal swab specimens and should not be used as a sole basis for treatment. Nasal washings and aspirates are unacceptable for Xpert Xpress SARS-CoV-2/FLU/RSV testing.  Fact Sheet for Patients: BloggerCourse.com  Fact Sheet for Healthcare Providers: SeriousBroker.it  This test is not yet approved or cleared by the [place] FDA and has been authorized for detection and/or diagnosis of SARS-CoV-2 by FDA under an Emergency Use Authorization (EUA). This EUA will remain in effect (meaning this test can be used) for the duration of the COVID-19 declaration under Section 564(b)(1) of the Act, 21 U.S.C. section 360bbb-3(b)(1), unless the  authorization is terminated or revoked.     Resp Syncytial Virus by PCR NEGATIVE NEGATIVE Final    Comment: (NOTE) Fact Sheet for Patients: BloggerCourse.com  Fact Sheet for Healthcare Providers: SeriousBroker.it  This test is not yet approved or cleared by the [place] FDA and has been authorized for detection and/or diagnosis of SARS-CoV-2 by FDA under an Emergency Use Authorization (EUA). This EUA will remain in effect (meaning this test can be used) for the duration of the COVID-19 declaration under Section 564(b)(1) of the Act, 21 U.S.C. section 360bbb-3(b)(1), unless the authorization is terminated or revoked.  Performed at [facility], [address], [place], [place] [address]   Gastrointestinal Panel by PCR , Stool     Status: None   Collection Time: 12/03/22  5:48 PM   Specimen: STOOL  Result Value Ref Range Status   Campylobacter species NOT DETECTED NOT DETECTED Final   Plesimonas shigelloides NOT DETECTED NOT DETECTED Final   Salmonella species NOT DETECTED NOT DETECTED Final   Yersinia enterocolitica NOT DETECTED NOT DETECTED Final   Vibrio species NOT DETECTED NOT DETECTED Final   Vibrio cholerae NOT DETECTED NOT DETECTED Final   Enteroaggregative E coli (EAEC) NOT DETECTED NOT DETECTED Final   Enteropathogenic E coli (EPEC) NOT DETECTED NOT DETECTED Final   Enterotoxigenic E coli (ETEC) NOT DETECTED NOT DETECTED Final   Shiga like toxin producing E coli (STEC) NOT DETECTED NOT DETECTED Final   Shigella/Enteroinvasive E coli (EIEC) NOT DETECTED NOT DETECTED Final   Cryptosporidium NOT DETECTED NOT DETECTED Final   Cyclospora cayetanensis NOT DETECTED NOT DETECTED Final   Entamoeba histolytica NOT DETECTED NOT DETECTED Final   Giardia lamblia NOT DETECTED NOT DETECTED Final   Adenovirus F40/41 NOT DETECTED NOT DETECTED Final   Astrovirus NOT DETECTED NOT DETECTED Final   Norovirus  GI/GII NOT DETECTED NOT DETECTED Final   Rotavirus A NOT DETECTED NOT DETECTED Final   Sapovirus (I, II, IV, and V) NOT DETECTED NOT DETECTED Final    Comment: Performed at [facility], [address] [place], [place] [address]  C Difficile Quick Screen w PCR reflex     Status: None   Collection Time: 12/03/22  5:49 PM   Specimen: STOOL  Result Value Ref Range Status   C Diff antigen NEGATIVE NEGATIVE Final   C Diff toxin NEGATIVE NEGATIVE Final   C Diff interpretation No C. difficile detected.  Final    Comment: Performed at [facility], [address], [place], [place] [address]  MRSA Next Gen by PCR, Nasal     Status: None   Collection Time: 12/03/22  6:44 PM   Specimen: Nasal Mucosa; Nasal Swab  Result Value Ref Range Status   MRSA by PCR Next Gen NOT DETECTED NOT DETECTED Final    Comment:        The GeneXpert MRSA Assay (FDA approved for NASAL specimens only), is one component of a comprehensive MRSA colonization surveillance program. It is not intended to diagnose MRSA infection nor to guide or monitor treatment for MRSA infections. Performed at [facility], [address] [place], [place] [address]          Radiology Studies: No results found.      Scheduled Meds:  aspirin EC  81 mg Oral Daily   atorvastatin  20 mg Oral Daily   budesonide (PULMICORT) nebulizer solution  0.25 mg Nebulization BID   enoxaparin (LOVENOX) injection  40 mg Subcutaneous Q24H   guaiFENesin  1,200 mg Oral BID   insulin aspart  0-15 Units Subcutaneous TID WC   ipratropium-albuterol  3 mL Nebulization BID   lisinopril  20 mg Oral Daily   metoprolol tartrate  25 mg Oral BID   multivitamin with minerals  1 tablet Oral Daily   nicotine  7 mg Transdermal Daily   pantoprazole  40 mg Oral Daily   Continuous Infusions:  azithromycin 500 mg (12/05/22 1232)   cefTRIAXone (ROCEPHIN)  IV 2 g (12/05/22 0952)     LOS: 1 day    Time  spent: 35 minutes    Elfida Shimada A Chakia Counts, MD [facility]   If 7PM-7AM, please contact night-coverage [email or web address]  12/05/2022, 3:41 PM

## 2022-12-06 ENCOUNTER — Encounter (HOSPITAL_COMMUNITY): Payer: Self-pay | Admitting: Internal Medicine

## 2022-12-06 ENCOUNTER — Inpatient Hospital Stay (HOSPITAL_COMMUNITY): Payer: BC Managed Care – PPO

## 2022-12-06 DIAGNOSIS — J189 Pneumonia, unspecified organism: Secondary | ICD-10-CM | POA: Diagnosis not present

## 2022-12-06 DIAGNOSIS — A419 Sepsis, unspecified organism: Secondary | ICD-10-CM | POA: Diagnosis not present

## 2022-12-06 LAB — CULTURE, BLOOD (ROUTINE X 2)

## 2022-12-06 LAB — CBC
HCT: 24.3 % — ABNORMAL LOW (ref 36.0–46.0)
Hemoglobin: 7.9 g/dL — ABNORMAL LOW (ref 12.0–15.0)
MCH: 31.7 pg (ref 26.0–34.0)
MCHC: 32.5 g/dL (ref 30.0–36.0)
MCV: 97.6 fL (ref 80.0–100.0)
Platelets: 369 10*3/uL (ref 150–400)
RBC: 2.49 MIL/uL — ABNORMAL LOW (ref 3.87–5.11)
RDW: 12.3 % (ref 11.5–15.5)
WBC: 7.9 10*3/uL (ref 4.0–10.5)
nRBC: 0.4 % — ABNORMAL HIGH (ref 0.0–0.2)

## 2022-12-06 LAB — HEPATIC FUNCTION PANEL
ALT: 49 U/L — ABNORMAL HIGH (ref 0–44)
AST: 37 U/L (ref 15–41)
Albumin: 2.7 g/dL — ABNORMAL LOW (ref 3.5–5.0)
Alkaline Phosphatase: 110 U/L (ref 38–126)
Bilirubin, Direct: <0.1 mg/dL (ref 0.0–0.2)
Total Bilirubin: 0.5 mg/dL (ref 0.3–1.2)
Total Protein: 6.8 g/dL (ref 6.5–8.1)

## 2022-12-06 LAB — BASIC METABOLIC PANEL
Anion gap: 10 (ref 5–15)
BUN: 8 mg/dL (ref 6–20)
CO2: 18 mmol/L — ABNORMAL LOW (ref 22–32)
Calcium: 8 mg/dL — ABNORMAL LOW (ref 8.9–10.3)
Chloride: 102 mmol/L (ref 98–111)
Creatinine, Ser: 0.62 mg/dL (ref 0.44–1.00)
GFR, Estimated: >60 mL/min (ref >60)
Glucose, Bld: 216 mg/dL — ABNORMAL HIGH (ref 70–99)
Potassium: 3.6 mmol/L (ref 3.5–5.1)
Sodium: 130 mmol/L — ABNORMAL LOW (ref 135–145)

## 2022-12-06 LAB — OCCULT BLOOD X 1 CARD TO LAB, STOOL: Fecal Occult Bld: NEGATIVE

## 2022-12-06 LAB — IRON AND TIBC
Iron: 32 ug/dL (ref 28–170)
Saturation Ratios: 15 % (ref 10.4–31.8)
TIBC: 216 ug/dL — ABNORMAL LOW (ref 250–450)
UIBC: 184 ug/dL

## 2022-12-06 LAB — FERRITIN: Ferritin: 1102 ng/mL — ABNORMAL HIGH (ref 11–307)

## 2022-12-06 LAB — GLUCOSE, CAPILLARY
Glucose-Capillary: 155 mg/dL — ABNORMAL HIGH (ref 70–99)
Glucose-Capillary: 163 mg/dL — ABNORMAL HIGH (ref 70–99)
Glucose-Capillary: 289 mg/dL — ABNORMAL HIGH (ref 70–99)
Glucose-Capillary: 107 mg/dL — ABNORMAL HIGH (ref 70–99)
Glucose-Capillary: 118 mg/dL — ABNORMAL HIGH (ref 70–99)

## 2022-12-06 LAB — RETICULOCYTES
Immature Retic Fract: 20 % — ABNORMAL HIGH (ref 2.3–15.9)
RBC.: 2.46 MIL/uL — ABNORMAL LOW (ref 3.87–5.11)
Retic Count, Absolute: 37.6 10*3/uL (ref 19.0–186.0)
Retic Ct Pct: 1.5 % (ref 0.4–3.1)

## 2022-12-06 LAB — FOLATE: Folate: 22.7 ng/mL (ref >5.9)

## 2022-12-06 LAB — VITAMIN B12: Vitamin B-12: 729 pg/mL (ref 180–914)

## 2022-12-06 MED ORDER — IPRATROPIUM-ALBUTEROL 0.5-2.5 (3) MG/3ML IN SOLN
3.0000 mL | Freq: Three times a day (TID) | RESPIRATORY_TRACT | Status: DC
  Administered 2022-12-06 – 2022-12-07 (×4): 3 mL via RESPIRATORY_TRACT
  Filled 2022-12-06 (×4): qty 3

## 2022-12-06 MED ORDER — IOHEXOL 9 MG/ML PO SOLN: 500.0000 mL | ORAL | Status: DC

## 2022-12-06 MED ORDER — CEFDINIR 300 MG PO CAPS
300.0000 mg | ORAL_CAPSULE | Freq: Two times a day (BID) | ORAL | Status: DC
  Administered 2022-12-06 – 2022-12-07 (×3): 300 mg via ORAL
  Filled 2022-12-06 (×3): qty 1

## 2022-12-06 MED ORDER — AZITHROMYCIN 250 MG PO TABS
500.0000 mg | ORAL_TABLET | Freq: Every day | ORAL | Status: AC
  Administered 2022-12-06 – 2022-12-07 (×2): 500 mg via ORAL
  Filled 2022-12-06 (×2): qty 2

## 2022-12-06 MED ORDER — FERROUS SULFATE 325 (65 FE) MG PO TABS
325.0000 mg | ORAL_TABLET | Freq: Every day | ORAL | Status: DC
  Administered 2022-12-06 – 2022-12-07 (×2): 325 mg via ORAL
  Filled 2022-12-06 (×2): qty 1

## 2022-12-06 MED ORDER — IOHEXOL 9 MG/ML PO SOLN
ORAL | Status: AC
  Filled 2022-12-06: qty 1000

## 2022-12-06 MED ORDER — AZITHROMYCIN 250 MG PO TABS: 500.0000 mg | ORAL_TABLET | Freq: Every day | ORAL | Status: DC

## 2022-12-06 MED ORDER — FOLIC ACID 1 MG PO TABS
1.0000 mg | ORAL_TABLET | Freq: Every day | ORAL | Status: DC
  Administered 2022-12-06 – 2022-12-07 (×2): 1 mg via ORAL
  Filled 2022-12-06 (×2): qty 1

## 2022-12-06 NOTE — Progress Notes (Signed)
PROGRESS NOTE    [name]  ZOX:[id number] DOB: 04/30/1976 DOA: 12/03/2022 PCP: Medicine, [facility]   Brief Narrative: 68 with past medical history significant for CAD, obesity BMI 36, hypertension, dyslipidemia, Crohn's disease, GERD who presents to the ED with complaining of chest wall pain, right lower ribs associated with cough and fever.  Chest x-ray showed new airspace consolidation in the right lower lobe concerning for pneumonia, she was tachycardic tachypneic febrile with temperature 102.  Patient has been admitted for pneumonia.   Assessment & Plan:   Principal Problem:   Sepsis due to pneumonia [facility]) Active Problems:   Hyponatremia   Hypokalemia   Uncontrolled type 2 diabetes mellitus with hyperglycemia, without long-term current use of insulin (HCC)   Severe obesity (BMI 35.0-39.9) with comorbidity (HCC)   TOBACCO ABUSE   Gastroesophageal reflux disease without esophagitis   Coronary artery disease   Essential hypertension   Crohn's disease of both small and large intestine without complication (HCC)   1-Sepsis  secondary to pneumonia: -Patient presents with fever, tachycardia, tachypnea, leukocytosis, chest x-ray showed right lower lobe pneumonia. -Treated IV ceftriaxone and azithromycin -Continue with IV fluids and follow blood cultures: no growth to date.  -Continue with  Pulmicort, Duo-neb.  Change antibiotics to oral.   2-Hyponatremia: In the setting of hydrochlorothiazide and dehydration. NSL fluids Improved.   3-Hypokalemia: Replaced.   4-uncontrolled type 2 diabetes with hyperglycemia Hold oral hypoglycemic agents.  Continue with a sliding scale insulin Resume Seemgle.   Anemia:  Hb trending down. Hb on admission 11. ---7.9 today.  anemia panel. Pending.  ? Hemodilution  Denies melena, hematochezia, heavy menstrual period.  Check CT abdomen pelvis. Occult blood.   Crohn's disease small and large bowel: Medicaid  infusion as an outpatient Denies abdominal pain.    Hypertension: Continue to hold lisinopril and hydrochlorothiazide, continue with metoprolol  CAD: Continue with aspirin metoprolol and statins  GERD: Continue with PPI  Tobacco abuse: Counseling.   Diarrhea; suspect related to acute illness, denies bloody stool or abdominal pain C diff negative. GI pathogen Negative.     Estimated body mass index is 35 kg/m as calculated from the following:   Height as of this encounter: 5\' 2"  (1.575 m).   Weight as of this encounter: 86.8 kg.   DVT prophylaxis: Lovenox Code Status: Full code Family Communication:Care discussed with patient and fiance who was at bedside.  Disposition Plan:  Status is: Observation The patient remains OBS appropriate and will d/c before 2 midnights.    Consultants:  None  Procedures:  None  Antimicrobials:  Ceftriaxone, Azithro  Subjective: She is feeling better. No evidence of active bleeding.  Plan to proceed with CT scan and occult blood.   Objective: Vitals:   12/06/22 0822 12/06/22 0948 12/06/22 1321 12/06/22 1402  BP:  (!) 165/101 (!) 141/99   Pulse:   94   Resp:   16   Temp:   98.9 F (37.2 C)   TempSrc:   Oral   SpO2: 97%  100% 97%  Weight:      Height:        Intake/Output Summary (Last 24 hours) at 12/06/2022 1415 Last data filed at 12/06/2022 1400 Gross per 24 hour  Intake 1263.26 ml  Output --  Net 1263.26 ml    Filed Weights   12/03/22 0406 12/03/22 1830  Weight: 90.7 kg 86.8 kg    Examination:  General exam: NAD Respiratory system: Normal resp effort Cardiovascular system:  S 1, S 2 RRR Gastrointestinal system: BS present, soft, nt Central nervous system: non focal Extremities:no edema    Data Reviewed: I have personally reviewed following labs and imaging studies  CBC: Recent Labs  Lab 12/03/22 0434 12/04/22 1509 12/05/22 0830 12/06/22 1337  WBC 19.1* 10.8* 8.5 7.9  NEUTROABS 13.5*  --   --   --    HGB 11.4* 9.4* 8.1* 7.9*  HCT 32.6* 27.7* 24.3* 24.3*  MCV 92.9 93.3 95.3 97.6  PLT 317 285 296 369    Basic Metabolic Panel: Recent Labs  Lab 12/03/22 0434 12/04/22 1421 12/05/22 0830  NA 128* 137 134*  K 3.4* 3.2* 3.2*  CL 94* 103 105  CO2 22 22 21*  GLUCOSE 259* 129* 242*  BUN 11 12 6   CREATININE 0.91 0.75 0.66  CALCIUM 8.7* 8.4* 8.1*    GFR: Estimated Creatinine Clearance: 88.9 mL/min (by C-G formula based on SCr of 0.66 mg/dL). Liver Function Tests: Recent Labs  Lab 12/03/22 0434  AST 22  ALT 25  ALKPHOS 68  BILITOT 1.4*  PROT 8.0  ALBUMIN 3.6    No results for input(s): "LIPASE", "AMYLASE" in the last 168 hours. No results for input(s): "AMMONIA" in the last 168 hours. Coagulation Profile: Recent Labs  Lab 12/03/22 0434 12/04/22 1421  INR 1.2 1.1    Cardiac Enzymes: No results for input(s): "CKTOTAL", "CKMB", "CKMBINDEX", "TROPONINI" in the last 168 hours. BNP (last 3 results) No results for input(s): "PROBNP" in the last 8760 hours. HbA1C: No results for input(s): "HGBA1C" in the last 72 hours.  CBG: Recent Labs  Lab 12/05/22 1642 12/05/22 2118 12/06/22 0401 12/06/22 0727 12/06/22 1137  GLUCAP 124* 177* 155* 163* 289*    Lipid Profile: No results for input(s): "CHOL", "HDL", "LDLCALC", "TRIG", "CHOLHDL", "LDLDIRECT" in the last 72 hours. Thyroid Function Tests: No results for input(s): "TSH", "T4TOTAL", "FREET4", "T3FREE", "THYROIDAB" in the last 72 hours. Anemia Panel: Recent Labs    12/06/22 1337  RETICCTPCT 1.5   Sepsis Labs: Recent Labs  Lab 12/03/22 0434 12/03/22 0611 12/04/22 1421  PROCALCITON  --   --  1.73  LATICACIDVEN 1.5 1.7  --      Recent Results (from the past 240 hour(s))  Blood Culture (routine x 2)     Status: None (Preliminary result)   Collection Time: 12/03/22  4:34 AM   Specimen: BLOOD  Result Value Ref Range Status   Specimen Description   Final    BLOOD RIGHT ANTECUBITAL Performed at [facility], [address], [place], [place] [address]    Special Requests   Final    BOTTLES DRAWN AEROBIC AND ANAEROBIC Blood Culture results may not be optimal due to an inadequate volume of blood received in culture bottles Performed at [facility], [address], [place], [place] [address]    Culture   Final    NO GROWTH 3 DAYS Performed at [facility], [address], [place], [place] [address]    Report Status PENDING  Incomplete  Blood Culture (routine x 2)     Status: None (Preliminary result)   Collection Time: 12/03/22  4:50 AM   Specimen: BLOOD  Result Value Ref Range Status   Specimen Description   Final    BLOOD BLOOD RIGHT WRIST Performed at [facility], [address] [place], [place] [address]    Special Requests   Final    BOTTLES DRAWN AEROBIC AND ANAEROBIC  Blood Culture adequate volume Performed at [facility], [address] [place], [place] [address]    Culture   Final    NO GROWTH 3 DAYS Performed at [facility], [address], [place], [place] [address]    Report Status PENDING  Incomplete  Resp panel by RT-PCR (RSV, Flu A&B, Covid) Anterior Nasal Swab     Status: None   Collection Time: 12/03/22  5:22 AM   Specimen: Anterior Nasal Swab  Result Value Ref Range Status   SARS Coronavirus 2 by RT PCR NEGATIVE NEGATIVE Final    Comment: (NOTE) SARS-CoV-2 target nucleic acids are NOT DETECTED.  The SARS-CoV-2 RNA is generally detectable in upper respiratory specimens during the acute phase of infection. The lowest concentration of SARS-CoV-2 viral copies this assay can detect is 138 copies/mL. A negative result does not preclude SARS-Cov-2 infection and should not be used as the sole basis for treatment or other patient management decisions. A negative result may occur with  improper specimen collection/handling, submission of specimen other than nasopharyngeal  swab, presence of viral mutation(s) within the areas targeted by this assay, and inadequate number of viral copies(<138 copies/mL). A negative result must be combined with clinical observations, patient history, and epidemiological information. The expected result is Negative.  Fact Sheet for Patients:  BloggerCourse.com  Fact Sheet for Healthcare Providers:  SeriousBroker.it  This test is no t yet approved or cleared by the [place] FDA and  has been authorized for detection and/or diagnosis of SARS-CoV-2 by FDA under an Emergency Use Authorization (EUA). This EUA will remain  in effect (meaning this test can be used) for the duration of the COVID-19 declaration under Section 564(b)(1) of the Act, 21 U.S.C.section 360bbb-3(b)(1), unless the authorization is terminated  or revoked sooner.       Influenza A by PCR NEGATIVE NEGATIVE Final   Influenza B by PCR NEGATIVE NEGATIVE Final    Comment: (NOTE) The Xpert Xpress SARS-CoV-2/FLU/RSV plus assay is intended as an aid in the diagnosis of influenza from Nasopharyngeal swab specimens and should not be used as a sole basis for treatment. Nasal washings and aspirates are unacceptable for Xpert Xpress SARS-CoV-2/FLU/RSV testing.  Fact Sheet for Patients: BloggerCourse.com  Fact Sheet for Healthcare Providers: SeriousBroker.it  This test is not yet approved or cleared by the [place] FDA and has been authorized for detection and/or diagnosis of SARS-CoV-2 by FDA under an Emergency Use Authorization (EUA). This EUA will remain in effect (meaning this test can be used) for the duration of the COVID-19 declaration under Section 564(b)(1) of the Act, 21 U.S.C. section 360bbb-3(b)(1), unless the authorization is terminated or revoked.     Resp Syncytial Virus by PCR NEGATIVE NEGATIVE Final    Comment: (NOTE) Fact Sheet for  Patients: BloggerCourse.com  Fact Sheet for Healthcare Providers: SeriousBroker.it  This test is not yet approved or cleared by the [place] FDA and has been authorized for detection and/or diagnosis of SARS-CoV-2 by FDA under an Emergency Use Authorization (EUA). This EUA will remain in effect (meaning this test can be used) for the duration of the COVID-19 declaration under Section 564(b)(1) of the Act, 21 U.S.C. section 360bbb-3(b)(1), unless the authorization is terminated or revoked.  Performed at [facility], [address], [place], [place] [address]   Gastrointestinal Panel by PCR , Stool     Status: None   Collection Time: 12/03/22  5:48 PM   Specimen: STOOL  Result  Value Ref Range Status   Campylobacter species NOT DETECTED NOT DETECTED Final   Plesimonas shigelloides NOT DETECTED NOT DETECTED Final   Salmonella species NOT DETECTED NOT DETECTED Final   Yersinia enterocolitica NOT DETECTED NOT DETECTED Final   Vibrio species NOT DETECTED NOT DETECTED Final   Vibrio cholerae NOT DETECTED NOT DETECTED Final   Enteroaggregative E coli (EAEC) NOT DETECTED NOT DETECTED Final   Enteropathogenic E coli (EPEC) NOT DETECTED NOT DETECTED Final   Enterotoxigenic E coli (ETEC) NOT DETECTED NOT DETECTED Final   Shiga like toxin producing E coli (STEC) NOT DETECTED NOT DETECTED Final   Shigella/Enteroinvasive E coli (EIEC) NOT DETECTED NOT DETECTED Final   Cryptosporidium NOT DETECTED NOT DETECTED Final   Cyclospora cayetanensis NOT DETECTED NOT DETECTED Final   Entamoeba histolytica NOT DETECTED NOT DETECTED Final   Giardia lamblia NOT DETECTED NOT DETECTED Final   Adenovirus F40/41 NOT DETECTED NOT DETECTED Final   Astrovirus NOT DETECTED NOT DETECTED Final   Norovirus GI/GII NOT DETECTED NOT DETECTED Final   Rotavirus A NOT DETECTED NOT DETECTED Final   Sapovirus (I, II, IV, and V) NOT DETECTED NOT  DETECTED Final    Comment: Performed at [facility], [address] [place], [place] [address]  C Difficile Quick Screen w PCR reflex     Status: None   Collection Time: 12/03/22  5:49 PM   Specimen: STOOL  Result Value Ref Range Status   C Diff antigen NEGATIVE NEGATIVE Final   C Diff toxin NEGATIVE NEGATIVE Final   C Diff interpretation No C. difficile detected.  Final    Comment: Performed at [facility], [address], [place], [place] [address]  MRSA Next Gen by PCR, Nasal     Status: None   Collection Time: 12/03/22  6:44 PM   Specimen: Nasal Mucosa; Nasal Swab  Result Value Ref Range Status   MRSA by PCR Next Gen NOT DETECTED NOT DETECTED Final    Comment:        The GeneXpert MRSA Assay (FDA approved for NASAL specimens only), is one component of a comprehensive MRSA colonization surveillance program. It is not intended to diagnose MRSA infection nor to guide or monitor treatment for MRSA infections. Performed at [facility], [address], [place], [place] [address]          Radiology Studies: No results found.      Scheduled Meds:  aspirin EC  81 mg Oral Daily   atorvastatin  20 mg Oral Daily   azithromycin  500 mg Oral Daily   budesonide (PULMICORT) nebulizer solution  0.25 mg Nebulization BID   guaiFENesin  1,200 mg Oral BID   insulin aspart  0-15 Units Subcutaneous TID WC   insulin glargine-yfgn  20 Units Subcutaneous QHS   ipratropium-albuterol  3 mL Nebulization TID   lisinopril  20 mg Oral Daily   metoprolol tartrate  25 mg Oral BID   multivitamin with minerals  1 tablet Oral Daily   nicotine  7 mg Transdermal Daily   pantoprazole  40 mg Oral Daily   Continuous Infusions:  cefTRIAXone (ROCEPHIN)  IV Stopped (12/06/22 1025)     LOS: 2 days    Time spent: 35 minutes    Jessah Danser A Donell Sliwinski, MD [facility]   If 7PM-7AM, please contact night-coverage [email or web address]  12/06/2022,  2:15 PM

## 2022-12-07 DIAGNOSIS — A419 Sepsis, unspecified organism: Secondary | ICD-10-CM | POA: Diagnosis not present

## 2022-12-07 DIAGNOSIS — J189 Pneumonia, unspecified organism: Secondary | ICD-10-CM | POA: Diagnosis not present

## 2022-12-07 LAB — GLUCOSE, CAPILLARY: Glucose-Capillary: 141 mg/dL — ABNORMAL HIGH (ref 70–99)

## 2022-12-07 LAB — HEMOGLOBIN AND HEMATOCRIT, BLOOD
HCT: 28 % — ABNORMAL LOW (ref 36.0–46.0)
Hemoglobin: 9.3 g/dL — ABNORMAL LOW (ref 12.0–15.0)

## 2022-12-07 MED ORDER — FERROUS SULFATE 325 (65 FE) MG PO TABS: 325.0000 mg | ORAL_TABLET | Freq: Every day | ORAL | 0 refills | Status: DC

## 2022-12-07 MED ORDER — IPRATROPIUM BROMIDE HFA 17 MCG/ACT IN AERS: 1.0000 | INHALATION_SPRAY | Freq: Three times a day (TID) | RESPIRATORY_TRACT | 2 refills | Status: DC

## 2022-12-07 MED ORDER — ALBUTEROL SULFATE HFA 108 (90 BASE) MCG/ACT IN AERS: 2.0000 | INHALATION_SPRAY | Freq: Four times a day (QID) | RESPIRATORY_TRACT | 2 refills | Status: AC | PRN

## 2022-12-07 MED ORDER — GUAIFENESIN ER 600 MG PO TB12: 1200.0000 mg | ORAL_TABLET | Freq: Two times a day (BID) | ORAL | 0 refills | Status: AC

## 2022-12-07 MED ORDER — CEFDINIR 300 MG PO CAPS: 300.0000 mg | ORAL_CAPSULE | Freq: Two times a day (BID) | ORAL | 0 refills | Status: AC

## 2022-12-07 NOTE — Discharge Summary (Signed)
Physician Discharge Summary   Patient: [name] MRN: [id number] DOB: 05-05-1976  Admit date:     12/03/2022  Discharge date: 12/07/22  Discharge Physician: Alba Cory   PCP: Medicine, [facility]   Recommendations at discharge:   Needs follow up with Hematologist for further evaluation of anemia of chronic diseases.  Needs follow up resolution of PNA. Needs Chest x ry in 2-4 weeks.   Discharge Diagnoses: Principal Problem:   Sepsis due to pneumonia [facility]) Active Problems:   Hyponatremia   Hypokalemia   Uncontrolled type 2 diabetes mellitus with hyperglycemia, without long-term current use of insulin (HCC)   Severe obesity (BMI 35.0-39.9) with comorbidity (HCC)   TOBACCO ABUSE   Gastroesophageal reflux disease without esophagitis   Coronary artery disease   Essential hypertension   Crohn's disease of both small and large intestine without complication (HCC)  Resolved Problems:   * No resolved hospital problems. [facility] Course: 47 with past medical history significant for CAD, obesity BMI 36, hypertension, dyslipidemia, Crohn's disease, GERD who presents to the ED with complaining of chest wall pain, right lower ribs associated with cough and fever. Chest x-ray showed new airspace consolidation in the right lower lobe concerning for pneumonia, she was tachycardic tachypneic febrile with temperature 102. Patient has been admitted for pneumonia.   Assessment and Plan: 1-Sepsis  secondary to pneumonia: -Patient presents with fever, tachycardia, tachypnea, leukocytosis, chest x-ray showed right lower lobe pneumonia. -Treated IV ceftriaxone and azithromycin initially.  -Treated  with IV fluids and follow blood cultures: no growth to date.  -Treated  with  Pulmicort, Duo-neb. Discharge on ipratropium and albuterol.  Change antibiotics to oral cefdinir, will complete 10 days. Small para-pneumonic effusion on CT. Will need follow up x ray.     2-Hyponatremia: In the setting of hydrochlorothiazide and dehydration. NSL fluids Improved.    3-Hypokalemia: Replaced.    4-uncontrolled type 2 diabetes with hyperglycemia Hold oral hypoglycemic agents.  Continue with a sliding scale insulin Resume Seemgle.    Anemia:  Hb trending down. Hb on admission 11. ---7.9 today.  anemia panel. Consistent with anemia of chronic diseases. Iron trial. ? Hemodilution  Denies melena, hematochezia, heavy menstrual period.  CT abdomen pelvis: negative for bleeding. Coronary calcification, patient will follow with cardiology . Occult blood negative.    Crohn's disease small and large bowel: Resume infusion as an outpatient per GI Denies abdominal pain.     Hypertension: resume at discharge  lisinopril and hydrochlorothiazide, continue with metoprolol   CAD: Continue with aspirin metoprolol and statins   GERD: Continue with PPI   Tobacco abuse: Counseling.    Diarrhea; suspect related to acute illness, denies bloody stool or abdominal pain C diff negative. GI pathogen Negative.             Consultants: None Procedures performed: None Disposition: Home Diet recommendation:  Discharge Diet Orders (From admission, onward)     Start     Ordered   12/07/22 0000  Diet - low sodium heart healthy        12/07/22 1122           Carb modified diet DISCHARGE MEDICATION: Allergies as of 12/07/2022       Reactions   Hydrocodone-acetaminophen Itching, Other (See Comments)   Generic that is white with red speckles caused itching. Patient states she can take this med.   Ezetimibe Swelling, Other (See Comments)   Swollen face, dark urine, fatigue  Simvastatin Other (See Comments)   Myalgias with 20mg  and 40mg    Rosuvastatin Hives, Itching, Rash, Other (See Comments)    with 40mg  dosing        Medication List     STOP taking these medications    esomeprazole 40 MG capsule Commonly known as: NEXIUM   icosapent Ethyl 1 g  capsule Commonly known as: Vascepa   meloxicam 15 MG tablet Commonly known as: MOBIC   methylPREDNISolone 4 MG Tbpk tablet Commonly known as: MEDROL DOSEPAK   Repatha SureClick 140 MG/ML Soaj Generic drug: Evolocumab       TAKE these medications    acetaminophen 325 MG tablet Commonly known as: TYLENOL Take 325-650 mg by mouth every 6 (six) hours as needed for headache (or pain).   albuterol 108 (90 Base) MCG/ACT inhaler Commonly known as: VENTOLIN HFA Inhale 2 puffs into the lungs every 6 (six) hours as needed for wheezing or shortness of breath.   aspirin EC 81 MG tablet Take 1 tablet (81 mg total) by mouth daily.   atorvastatin 20 MG tablet Commonly known as: LIPITOR Take 1 tablet by mouth once daily What changed: when to take this   cefdinir 300 MG capsule Commonly known as: OMNICEF Take 1 capsule (300 mg total) by mouth every 12 (twelve) hours for 6 days.   ferrous sulfate 325 (65 FE) MG tablet Take 1 tablet (325 mg total) by mouth daily. Start taking on: Dec 08, 2022   glipiZIDE 5 MG 24 hr tablet Commonly known as: GLUCOTROL XL Take 5 mg by mouth daily with breakfast.   guaiFENesin 600 MG 12 hr tablet Commonly known as: MUCINEX Take 2 tablets (1,200 mg total) by mouth 2 (two) times daily.   inFLIXimab 100 MG injection Commonly known as: REMICADE Inject 100 mg into the vein every 8 (eight) weeks.   ipratropium 17 MCG/ACT inhaler Commonly known as: ATROVENT HFA Inhale 1 puff into the lungs 3 (three) times daily.   Lantus SoloStar 100 UNIT/ML Solostar Pen Generic drug: insulin glargine Inject 23 Units into the skin at bedtime.   lisinopril-hydrochlorothiazide 20-12.5 MG tablet Commonly known as: ZESTORETIC Take 2 tablets by mouth daily. Please make overdue appt with Dr. Excell Seltzer before anymore refills. 1st attempt What changed:  when to take this additional instructions   metFORMIN 500 MG 24 hr tablet Commonly known as: GLUCOPHAGE-XR Take 1,000 mg  by mouth 2 (two) times daily.   metoprolol tartrate 50 MG tablet Commonly known as: LOPRESSOR Take 1 tablet by mouth twice daily   multivitamin with minerals Tabs tablet Take 1 tablet by mouth daily with breakfast.   omeprazole 40 MG capsule Commonly known as: PRILOSEC Take 40 mg by mouth daily before breakfast.   potassium chloride 10 MEQ tablet Commonly known as: KLOR-CON Take 10 mEq by mouth daily.        Follow-up Information     Medicine, [facility] Follow up in 1 week(s).   Specialty: Family Medicine               Discharge Exam: Filed Weights   12/03/22 0406 12/03/22 1830  Weight: 90.7 kg 86.8 kg   General; NAD  Condition at discharge: stable  The results of significant diagnostics from this hospitalization (including imaging, microbiology, ancillary and laboratory) are listed below for reference.   Imaging Studies: CT ABDOMEN PELVIS WO CONTRAST  Result Date: 12/06/2022 CLINICAL DATA:  Anemia, diarrhea.  History of Crohn's enterocolitis. EXAM: CT ABDOMEN AND PELVIS  WITHOUT CONTRAST TECHNIQUE: Multidetector CT imaging of the abdomen and pelvis was performed following the standard protocol without IV contrast. RADIATION DOSE REDUCTION: This exam was performed according to the departmental dose-optimization program which includes automated exposure control, adjustment of the mA and/or kV according to patient size and/or use of iterative reconstruction technique. COMPARISON:  None Available. FINDINGS: Lower chest: Dense consolidation within the a right lower lobe in keeping with changes of acute lobar pneumonia. Small right parapneumonic effusion. Left lung bases clear. Extensive coronary artery calcification. Global cardiac size within normal limits. Hepatobiliary: No focal liver abnormality is seen. No gallstones, gallbladder wall thickening, or biliary dilatation. Pancreas: Unremarkable Spleen: Unremarkable Adrenals/Urinary Tract: Adrenal glands  are unremarkable. Kidneys are normal, without renal calculi, focal lesion, or hydronephrosis. Bladder is unremarkable. Stomach/Bowel: Stomach is within normal limits. Appendix appears normal. No evidence of bowel wall thickening, distention, or inflammatory changes. Vascular/Lymphatic: There is moderate aortoiliac atherosclerotic calcification with particularly prominent atherosclerotic calcifications seen at the origin of the superior mesenteric artery and renal artery origins. The degree of stenosis, however, is not well assessed on this noncontrast examination. No aortic aneurysm. No pathologic adenopathy within the abdomen and pelvis. Reproductive: Uterine calcifications in keeping with probable involuted uterine fibroids. The pelvic organs are are otherwise unremarkable. Other: Small fat containing umbilical hernia. Musculoskeletal: No acute or significant osseous findings. IMPRESSION: 1. Dense consolidation within the right lower lobe in keeping with changes of acute lobar pneumonia. Small right parapneumonic effusion. 2. Extensive coronary artery calcification. 3. Moderate aortoiliac atherosclerotic calcification with particularly prominent atherosclerotic calcifications seen at the origin of the superior mesenteric artery and renal artery origins. The degree of stenosis, however, is not well assessed on this noncontrast examination. If there is clinical evidence of hemodynamically significant renal artery stenosis, CT arteriography may be more helpful for further evaluation. 4. Fibroid uterus. Aortic Atherosclerosis (ICD10-I70.0). Electronically Signed   By: Helyn Numbers M.D.   On: 12/06/2022 19:54   DG Chest Port 1 View  Result Date: 12/03/2022 CLINICAL DATA:  47 year old female with fever and possible sepsis. EXAM: PORTABLE CHEST 1 VIEW COMPARISON:  Chest x-ray 04/23/2017. FINDINGS: Elevation of the right hemidiaphragm. New consolidation in the base of the right lung partially obscuring the right  hemidiaphragm. Left lung is clear. Probable small right pleural effusion. No definite left pleural effusion. No evidence of pulmonary edema. Heart size is normal. Upper mediastinal contours are within normal limits. IMPRESSION: 1. New airspace consolidation in the right lower lobe concerning for pneumonia. Followup PA and lateral chest X-ray is recommended in 3-4 weeks following trial of antibiotic therapy to ensure resolution and exclude underlying malignancy. 2. Small right pleural effusion. Electronically Signed   By: Trudie Reed M.D.   On: 12/03/2022 06:01    Microbiology: Results for orders placed or performed during the hospital encounter of 12/03/22  Blood Culture (routine x 2)     Status: None (Preliminary result)   Collection Time: 12/03/22  4:34 AM   Specimen: BLOOD  Result Value Ref Range Status   Specimen Description   Final    BLOOD RIGHT ANTECUBITAL Performed at [facility], [address] [place], [place] [address]    Special Requests   Final    BOTTLES DRAWN AEROBIC AND ANAEROBIC Blood Culture results may not be optimal due to an inadequate volume of blood received in culture bottles Performed at [facility], [address] [place], [place] [address]    Culture   Final  NO GROWTH 4 DAYS Performed at [facility], [address], [place], [place] [address]    Report Status PENDING  Incomplete  Blood Culture (routine x 2)     Status: None (Preliminary result)   Collection Time: 12/03/22  4:50 AM   Specimen: BLOOD  Result Value Ref Range Status   Specimen Description   Final    BLOOD BLOOD RIGHT WRIST Performed at [facility], [address], [place], [place] [address]    Special Requests   Final    BOTTLES DRAWN AEROBIC AND ANAEROBIC Blood Culture adequate volume Performed at [facility], [address] [place], [place] [address]    Culture   Final    NO GROWTH 4  DAYS Performed at [facility], [address], [place], [place] [address]    Report Status PENDING  Incomplete  Resp panel by RT-PCR (RSV, Flu A&B, Covid) Anterior Nasal Swab     Status: None   Collection Time: 12/03/22  5:22 AM   Specimen: Anterior Nasal Swab  Result Value Ref Range Status   SARS Coronavirus 2 by RT PCR NEGATIVE NEGATIVE Final    Comment: (NOTE) SARS-CoV-2 target nucleic acids are NOT DETECTED.  The SARS-CoV-2 RNA is generally detectable in upper respiratory specimens during the acute phase of infection. The lowest concentration of SARS-CoV-2 viral copies this assay can detect is 138 copies/mL. A negative result does not preclude SARS-Cov-2 infection and should not be used as the sole basis for treatment or other patient management decisions. A negative result may occur with  improper specimen collection/handling, submission of specimen other than nasopharyngeal swab, presence of viral mutation(s) within the areas targeted by this assay, and inadequate number of viral copies(<138 copies/mL). A negative result must be combined with clinical observations, patient history, and epidemiological information. The expected result is Negative.  Fact Sheet for Patients:  BloggerCourse.com  Fact Sheet for Healthcare Providers:  SeriousBroker.it  This test is no t yet approved or cleared by the [place] FDA and  has been authorized for detection and/or diagnosis of SARS-CoV-2 by FDA under an Emergency Use Authorization (EUA). This EUA will remain  in effect (meaning this test can be used) for the duration of the COVID-19 declaration under Section 564(b)(1) of the Act, 21 U.S.C.section 360bbb-3(b)(1), unless the authorization is terminated  or revoked sooner.       Influenza A by PCR NEGATIVE NEGATIVE Final   Influenza B by PCR NEGATIVE NEGATIVE Final    Comment: (NOTE) The Xpert Xpress SARS-CoV-2/FLU/RSV  plus assay is intended as an aid in the diagnosis of influenza from Nasopharyngeal swab specimens and should not be used as a sole basis for treatment. Nasal washings and aspirates are unacceptable for Xpert Xpress SARS-CoV-2/FLU/RSV testing.  Fact Sheet for Patients: BloggerCourse.com  Fact Sheet for Healthcare Providers: SeriousBroker.it  This test is not yet approved or cleared by the [place] FDA and has been authorized for detection and/or diagnosis of SARS-CoV-2 by FDA under an Emergency Use Authorization (EUA). This EUA will remain in effect (meaning this test can be used) for the duration of the COVID-19 declaration under Section 564(b)(1) of the Act, 21 U.S.C. section 360bbb-3(b)(1), unless the authorization is terminated or revoked.     Resp Syncytial Virus by PCR NEGATIVE NEGATIVE Final    Comment: (NOTE) Fact Sheet for Patients: BloggerCourse.com  Fact Sheet for Healthcare Providers: SeriousBroker.it  This test is not yet approved or cleared by  the [employer] and has been authorized for detection and/or diagnosis of SARS-CoV-2 by FDA under an Emergency Use Authorization (EUA). This EUA will remain in effect (meaning this test can be used) for the duration of the COVID-19 declaration under Section 564(b)(1) of the Act, 21 U.S.C. section 360bbb-3(b)(1), unless the authorization is terminated or revoked.  Performed at [facility], [address], [place], [place] [address]   Gastrointestinal Panel by PCR , Stool     Status: None   Collection Time: 12/03/22  5:48 PM   Specimen: STOOL  Result Value Ref Range Status   Campylobacter species NOT DETECTED NOT DETECTED Final   Plesimonas shigelloides NOT DETECTED NOT DETECTED Final   Salmonella species NOT DETECTED NOT DETECTED Final   Yersinia enterocolitica NOT DETECTED NOT DETECTED Final    Vibrio species NOT DETECTED NOT DETECTED Final   Vibrio cholerae NOT DETECTED NOT DETECTED Final   Enteroaggregative E coli (EAEC) NOT DETECTED NOT DETECTED Final   Enteropathogenic E coli (EPEC) NOT DETECTED NOT DETECTED Final   Enterotoxigenic E coli (ETEC) NOT DETECTED NOT DETECTED Final   Shiga like toxin producing E coli (STEC) NOT DETECTED NOT DETECTED Final   Shigella/Enteroinvasive E coli (EIEC) NOT DETECTED NOT DETECTED Final   Cryptosporidium NOT DETECTED NOT DETECTED Final   Cyclospora cayetanensis NOT DETECTED NOT DETECTED Final   Entamoeba histolytica NOT DETECTED NOT DETECTED Final   Giardia lamblia NOT DETECTED NOT DETECTED Final   Adenovirus F40/41 NOT DETECTED NOT DETECTED Final   Astrovirus NOT DETECTED NOT DETECTED Final   Norovirus GI/GII NOT DETECTED NOT DETECTED Final   Rotavirus A NOT DETECTED NOT DETECTED Final   Sapovirus (I, II, IV, and V) NOT DETECTED NOT DETECTED Final    Comment: Performed at [facility], [address] [place], [place] [address]  C Difficile Quick Screen w PCR reflex     Status: None   Collection Time: 12/03/22  5:49 PM   Specimen: STOOL  Result Value Ref Range Status   C Diff antigen NEGATIVE NEGATIVE Final   C Diff toxin NEGATIVE NEGATIVE Final   C Diff interpretation No C. difficile detected.  Final    Comment: Performed at [facility], [address] [place], [place] [address]  MRSA Next Gen by PCR, Nasal     Status: None   Collection Time: 12/03/22  6:44 PM   Specimen: Nasal Mucosa; Nasal Swab  Result Value Ref Range Status   MRSA by PCR Next Gen NOT DETECTED NOT DETECTED Final    Comment:        The GeneXpert MRSA Assay (FDA approved for NASAL specimens only), is one component of a comprehensive MRSA colonization surveillance program. It is not intended to diagnose MRSA infection nor to guide or monitor treatment for MRSA infections. Performed at [facility], [address], [place], [place] [address]     Labs: CBC: Recent Labs  Lab 12/03/22 0434 12/04/22 1509 12/05/22 0830 12/06/22 1337 12/07/22 0856  WBC 19.1* 10.8* 8.5 7.9  --   NEUTROABS 13.5*  --   --   --   --   HGB 11.4* 9.4* 8.1* 7.9* 9.3*  HCT 32.6* 27.7* 24.3* 24.3* 28.0*  MCV 92.9 93.3 95.3 97.6  --   PLT 317 285 296 369  --    Basic Metabolic Panel: Recent Labs  Lab 12/03/22 0434 12/04/22 1421 12/05/22 0830 12/06/22 1337  NA 128* 137 134* 130*  K 3.4* 3.2* 3.2* 3.6  CL 94* 103 105 102  CO2 22 22 21* 18*  GLUCOSE 259* 129* 242* 216*  BUN 11 12 6 8   CREATININE 0.91 0.75 0.66 0.62  CALCIUM 8.7* 8.4* 8.1* 8.0*   Liver Function Tests: Recent Labs  Lab 12/03/22 0434 12/06/22 1337  AST 22 37  ALT 25 49*  ALKPHOS 68 110  BILITOT 1.4* 0.5  PROT 8.0 6.8  ALBUMIN 3.6 2.7*   CBG: Recent Labs  Lab 12/06/22 0727 12/06/22 1137 12/06/22 1633 12/06/22 2016 12/07/22 0728  GLUCAP 163* 289* 107* 118* 141*    Discharge time spent: greater than 30 minutes.  Signed: Alba Cory, MD [facility] 12/07/2022

## 2022-12-07 NOTE — Plan of Care (Signed)
  Problem: Fluid Volume: Goal: Hemodynamic stability will improve Outcome: Progressing   Problem: Education: Goal: Knowledge of General Education information will improve Description: Including pain rating scale, medication(s)/side effects and non-pharmacologic comfort measures Outcome: Progressing   Problem: Health Behavior/Discharge Planning: Goal: Ability to manage health-related needs will improve Outcome: Progressing   Problem: Clinical Measurements: Goal: Will remain free from infection Outcome: Progressing Goal: Diagnostic test results will improve Outcome: Progressing Goal: Respiratory complications will improve Outcome: Progressing   Problem: Activity: Goal: Risk for activity intolerance will decrease Outcome: Progressing   Problem: Nutrition: Goal: Adequate nutrition will be maintained Outcome: Progressing

## 2022-12-08 ENCOUNTER — Other Ambulatory Visit: Payer: Self-pay | Admitting: Cardiovascular Disease

## 2022-12-08 LAB — CULTURE, BLOOD (ROUTINE X 2)
Culture: NO GROWTH
Special Requests: ADEQUATE

## 2022-12-08 LAB — LEGIONELLA PNEUMOPHILA SEROGP 1 UR AG: L. pneumophila Serogp 1 Ur Ag: POSITIVE — AB

## 2022-12-09 ENCOUNTER — Other Ambulatory Visit: Payer: Self-pay

## 2022-12-09 MED ORDER — METOPROLOL TARTRATE 50 MG PO TABS: 50.0000 mg | ORAL_TABLET | Freq: Two times a day (BID) | ORAL | 0 refills | Status: DC

## 2022-12-09 NOTE — Telephone Encounter (Signed)
The patient  called stating that she needed a refill on her metoprolol and that she has an appointment scheduled for June.  The refill was sent to the patient's pharmacy.

## 2022-12-17 ENCOUNTER — Other Ambulatory Visit: Payer: Self-pay | Admitting: Pharmacist

## 2022-12-17 ENCOUNTER — Encounter: Payer: Self-pay | Admitting: Pharmacist

## 2022-12-17 MED ORDER — REPATHA SURECLICK 140 MG/ML ~~LOC~~ SOAJ: 140.0000 mg | SUBCUTANEOUS | 11 refills | Status: DC

## 2023-01-01 ENCOUNTER — Emergency Department (HOSPITAL_COMMUNITY)
Admission: EM | Admit: 2023-01-01 | Discharge: 2023-01-01 | Disposition: A | Discharge status: Home / Self Care | Payer: BC Managed Care – PPO | Attending: Emergency Medicine | Admitting: Emergency Medicine

## 2023-01-01 ENCOUNTER — Encounter (HOSPITAL_COMMUNITY): Payer: Self-pay | Admitting: *Deleted

## 2023-01-01 ENCOUNTER — Other Ambulatory Visit: Payer: Self-pay

## 2023-01-01 ENCOUNTER — Emergency Department (HOSPITAL_COMMUNITY): Payer: BC Managed Care – PPO

## 2023-01-01 DIAGNOSIS — I2510 Atherosclerotic heart disease of native coronary artery without angina pectoris: Secondary | ICD-10-CM | POA: Insufficient documentation

## 2023-01-01 DIAGNOSIS — A481 Legionnaires' disease: Secondary | ICD-10-CM | POA: Insufficient documentation

## 2023-01-01 DIAGNOSIS — Z79899 Other long term (current) drug therapy: Secondary | ICD-10-CM | POA: Diagnosis not present

## 2023-01-01 DIAGNOSIS — Z794 Long term (current) use of insulin: Secondary | ICD-10-CM | POA: Insufficient documentation

## 2023-01-01 DIAGNOSIS — I10 Essential (primary) hypertension: Secondary | ICD-10-CM | POA: Diagnosis not present

## 2023-01-01 DIAGNOSIS — Z7982 Long term (current) use of aspirin: Secondary | ICD-10-CM | POA: Diagnosis not present

## 2023-01-01 DIAGNOSIS — R059 Cough, unspecified: Secondary | ICD-10-CM | POA: Diagnosis present

## 2023-01-01 LAB — COMPREHENSIVE METABOLIC PANEL
ALT: 21 U/L (ref 0–44)
AST: 20 U/L (ref 15–41)
Albumin: 4 g/dL (ref 3.5–5.0)
Alkaline Phosphatase: 62 U/L (ref 38–126)
Anion gap: 9 (ref 5–15)
BUN: 12 mg/dL (ref 6–20)
CO2: 27 mmol/L (ref 22–32)
Calcium: 8.9 mg/dL (ref 8.9–10.3)
Chloride: 102 mmol/L (ref 98–111)
Creatinine, Ser: 0.87 mg/dL (ref 0.44–1.00)
GFR, Estimated: >60 mL/min (ref >60)
Glucose, Bld: 222 mg/dL — ABNORMAL HIGH (ref 70–99)
Potassium: 3 mmol/L — ABNORMAL LOW (ref 3.5–5.1)
Sodium: 138 mmol/L (ref 135–145)
Total Bilirubin: 0.4 mg/dL (ref 0.3–1.2)
Total Protein: 7.6 g/dL (ref 6.5–8.1)

## 2023-01-01 LAB — CBC WITH DIFFERENTIAL/PLATELET
Abs Immature Granulocytes: 0.02 10*3/uL (ref 0.00–0.07)
Basophils Absolute: 0 10*3/uL (ref 0.0–0.1)
Basophils Relative: 0 %
Eosinophils Absolute: 0.3 10*3/uL (ref 0.0–0.5)
Eosinophils Relative: 3 %
HCT: 33.1 % — ABNORMAL LOW (ref 36.0–46.0)
Hemoglobin: 10.8 g/dL — ABNORMAL LOW (ref 12.0–15.0)
Immature Granulocytes: 0 %
Lymphocytes Relative: 37 %
Lymphs Abs: 3.4 10*3/uL (ref 0.7–4.0)
MCH: 32 pg (ref 26.0–34.0)
MCHC: 32.6 g/dL (ref 30.0–36.0)
MCV: 98.2 fL (ref 80.0–100.0)
Monocytes Absolute: 0.6 10*3/uL (ref 0.1–1.0)
Monocytes Relative: 6 %
Neutro Abs: 4.8 10*3/uL (ref 1.7–7.7)
Neutrophils Relative %: 54 %
Platelets: 329 10*3/uL (ref 150–400)
RBC: 3.37 MIL/uL — ABNORMAL LOW (ref 3.87–5.11)
RDW: 13.4 % (ref 11.5–15.5)
WBC: 9.1 10*3/uL (ref 4.0–10.5)
nRBC: 0 % (ref 0.0–0.2)

## 2023-01-01 LAB — I-STAT BETA HCG BLOOD, ED (MC, WL, AP ONLY): I-stat hCG, quantitative: <5 m[IU]/mL (ref <5)

## 2023-01-01 LAB — LACTIC ACID, PLASMA: Lactic Acid, Venous: 1.3 mmol/L (ref 0.5–1.9)

## 2023-01-01 MED ORDER — FLUCONAZOLE 150 MG PO TABS: 150.0000 mg | ORAL_TABLET | Freq: Every day | ORAL | 0 refills | Status: AC

## 2023-01-01 MED ORDER — POTASSIUM CHLORIDE CRYS ER 20 MEQ PO TBCR
20.0000 meq | EXTENDED_RELEASE_TABLET | Freq: Once | ORAL | Status: AC
  Administered 2023-01-01: 20 meq via ORAL
  Filled 2023-01-01: qty 1

## 2023-01-01 MED ORDER — SODIUM CHLORIDE 0.9 % IV SOLN
500.0000 mg | Freq: Once | INTRAVENOUS | Status: AC
  Administered 2023-01-01: 500 mg via INTRAVENOUS
  Filled 2023-01-01: qty 5

## 2023-01-01 MED ORDER — AZITHROMYCIN 250 MG PO TABS: 250.0000 mg | ORAL_TABLET | Freq: Every day | ORAL | 0 refills | Status: AC

## 2023-01-01 NOTE — Discharge Instructions (Signed)
Thank you for coming to [facility] Emergency Department. You were seen for continued findings of pneumonia on your chest x-ray with your primary care physician. We did an exam, labs, and imaging, and these showed pneumonia. 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  We will treat you with azithromycin 250 mg once daily for 7 days.  You also received IV azithromycin in the emergency department prior to discharge.  [place] [employer] is part of the epidemiology section of the [employer], who deals with communicable diseases in our state.  If you wanted to talk more with them about Legionella, you can call them at [phone].  Please follow up with your primary care provider within 1 week.   Do not hesitate to return to the ED or call 911 if you experience: -Worsening symptoms -Shortness of breath -Cough or coughing up blood -Worsening diarrhea -Lightheadedness, passing out -Fevers/chills -Anything else that concerns you

## 2023-01-01 NOTE — ED Triage Notes (Signed)
Pt was hospitalized for pnuemonia about a month ago and took all abt fully after discharge.  Pt had follow up with PCP yesterday and had a CXR.  Today she was called and told to come to ER due to CXR showing right lower lobe pneumonia.  Pt has been feeling well, no sob, no fever.  Spo2 100% in triage.

## 2023-01-01 NOTE — ED Provider Notes (Signed)
[place] EMERGENCY DEPARTMENT AT [facility] Provider Note   CSN: [id number] Arrival date & time: 01/01/23  1342     History  Chief Complaint  Patient presents with   Pneumonia    [name] is a 47 y.o. female with Crohns disease on remicaid,  presents with persistent CXR findings of pneumonia.  Per history and chart review patient was recently admitted from 12/03/22-12/07/22 for RLL pneumonia, treated w/ IV ceftriaxzone aztirhomycin and DC'd w/ 10 days of cefdinir. Before, patient had symptoms including chest/back pain, fever, small cough as well as severe diarrhea.  Her symptoms improved after treatment and she took all the cefdinir at home and finish the course.  She has not had recurrence of her symptoms since that time.  Was tested for legionella during her admission, and patient was called by the health department and notified that she was positive for legionella. She saw her PCP and had repeat CXR for routine f/u, still has persistent consolidation in RLL though aeration is improved. Patient does not live or work at a nursing home, doesn't work at jail, never had this before. Takes Remicaid for crohn's disease, supposed to have her next infusion tomorrow.   Pneumonia       Home Medications Prior to Admission medications   Medication Sig Start Date End Date Taking? Authorizing Provider  azithromycin (ZITHROMAX) 250 MG tablet Take 1 tablet (250 mg total) by mouth daily for 7 days. 01/02/23 01/09/23 Yes Loetta Rough, MD  fluconazole (DIFLUCAN) 150 MG tablet Take 1 tablet (150 mg total) by mouth daily for 1 dose. If you develop symptoms of a yeast infection while taking antibiotics. 01/01/23 01/02/23 Yes Loetta Rough, MD  acetaminophen (TYLENOL) 325 MG tablet Take 325-650 mg by mouth every 6 (six) hours as needed for headache (or pain).    [provider]  albuterol (VENTOLIN HFA) 108 (90 Base) MCG/ACT inhaler Inhale 2 puffs into the lungs every 6  (six) hours as needed for wheezing or shortness of breath. 12/07/22   Regalado, Jon Billings A, MD  aspirin EC 81 MG tablet Take 1 tablet (81 mg total) by mouth daily. 01/06/11   Tonny Bollman, MD  atorvastatin (LIPITOR) 20 MG tablet Take 1 tablet by mouth once daily Patient taking differently: Take 20 mg by mouth at bedtime. 09/01/22   Tonny Bollman, MD  Evolocumab (REPATHA SURECLICK) 140 MG/ML SOAJ Inject 140 mg into the skin every 14 (fourteen) days. 12/17/22   Tonny Bollman, MD  ferrous sulfate 325 (65 FE) MG tablet Take 1 tablet (325 mg total) by mouth daily. 12/08/22   Regalado, Belkys A, MD  glipiZIDE (GLUCOTROL XL) 5 MG 24 hr tablet Take 5 mg by mouth daily with breakfast.    [provider]  guaiFENesin (MUCINEX) 600 MG 12 hr tablet Take 2 tablets (1,200 mg total) by mouth 2 (two) times daily. 12/07/22   Regalado, Belkys A, MD  inFLIXimab (REMICADE) 100 MG injection Inject 100 mg into the vein every 8 (eight) weeks.    [provider]  ipratropium (ATROVENT HFA) 17 MCG/ACT inhaler Inhale 1 puff into the lungs 3 (three) times daily. 12/07/22 12/07/23  Regalado, Belkys A, MD  LANTUS SOLOSTAR 100 UNIT/ML Solostar Pen Inject 23 Units into the skin at bedtime.    [provider]  lisinopril-hydrochlorothiazide (ZESTORETIC) 20-12.5 MG tablet Take 2 tablets by mouth daily. Please make overdue appt with Dr. Excell Seltzer before anymore refills. 1st attempt Patient taking differently: Take 2 tablets  by mouth in the morning. 01/23/20   Tonny Bollman, MD  metFORMIN (GLUCOPHAGE-XR) 500 MG 24 hr tablet Take 1,000 mg by mouth 2 (two) times daily.    [provider]  metoprolol tartrate (LOPRESSOR) 50 MG tablet Take 1 tablet (50 mg total) by mouth 2 (two) times daily. Please keep scheduled appointment for future refills. Thank you. 12/09/22   Tonny Bollman, MD  Multiple Vitamin (MULTIVITAMIN WITH MINERALS) TABS tablet Take 1 tablet by mouth daily with breakfast.    [provider]   omeprazole (PRILOSEC) 40 MG capsule Take 40 mg by mouth daily before breakfast. 12/02/20   [provider]  potassium chloride (KLOR-CON) 10 MEQ tablet Take 10 mEq by mouth daily.  11/09/19   [provider]      Allergies    Hydrocodone-acetaminophen, Ezetimibe, Simvastatin, and Rosuvastatin    Review of Systems   Review of Systems Review of systems Negative for f/c, CP, SOB, cough, abd pain, N/V/D/C.  A 10 point review of systems was performed and is negative unless otherwise reported in HPI.  Physical Exam Updated Vital Signs BP 132/82   Pulse 83   Temp 98 F (36.7 C) (Oral)   Resp 17   LMP  (LMP Unknown)   SpO2 99%  Physical Exam General: Normal appearing female, lying in bed.  HEENT: PERRLA, Sclera anicteric, MMM, trachea midline.  Cardiology: RRR, no murmurs/rubs/gallops. BL radial and DP pulses equal bilaterally.  Resp: Normal respiratory rate and effort. CTAB, no wheezes, rhonchi, crackles.  Abd: Soft, non-tender, non-distended. No rebound tenderness or guarding.  GU: Deferred. MSK: No peripheral edema or signs of trauma. Extremities without deformity or TTP. No cyanosis or clubbing. Skin: warm, dry. No rashes or lesions. Back: No CVA tenderness Neuro: A&Ox4, CNs II-XII grossly intact. MAEs. Sensation grossly intact.  Psych: Normal mood and affect.   ED Results / Procedures / Treatments   Labs (all labs ordered are listed, but only abnormal results are displayed) Labs Reviewed  COMPREHENSIVE METABOLIC PANEL - Abnormal; Notable for the following components:      Result Value   Potassium 3.0 (*)    Glucose, Bld 222 (*)    All other components within normal limits  CBC WITH DIFFERENTIAL/PLATELET - Abnormal; Notable for the following components:   RBC 3.37 (*)    Hemoglobin 10.8 (*)    HCT 33.1 (*)    All other components within normal limits  LACTIC ACID, PLASMA  I-STAT BETA HCG BLOOD, ED ([facility], [facility], [facility] ONLY)    EKG None  Radiology DG Chest 2  View  Result Date: 01/01/2023 CLINICAL DATA:  Pneumonia. EXAM: CHEST - 2 VIEW COMPARISON:  Chest x-ray Dec 03, 2022. FINDINGS: Persistent right basilar opacities, likely pneumonia. Aeration is mildly improved. Remainder lungs are clear. No visible pleural effusions or pneumothorax. Cardiomediastinal silhouette is within normal limits. No acute osseous abnormality. IMPRESSION: Persistent right basilar opacities, likely pneumonia. Aeration is mildly improved. Electronically Signed   By: Feliberto Harts M.D.   On: 01/01/2023 14:32    Procedures Procedures    Medications Ordered in ED Medications  azithromycin (ZITHROMAX) 500 mg in sodium chloride 0.9 % 250 mL IVPB (0 mg Intravenous Stopped 01/01/23 1633)  potassium chloride SA (KLOR-CON M) CR tablet 20 mEq (20 mEq Oral Given 01/01/23 1636)    ED Course/ Medical Decision Making/ A&P  Medical Decision Making Amount and/or Complexity of Data Reviewed Labs: ordered. Decision-making details documented in ED Course. Radiology: ordered. Decision-making details documented in ED Course.  Risk Prescription drug management.    This patient presents to the ED for concern of ongonig PNA, this involves an extensive number of treatment options, and is a complaint that carries with it a high risk of complications and morbidity. She is asymptomatic at this time and HDS, afebrile.  MDM:    Per chart review, patient tested positive for legionella on 12/08/22 and had been given cefdinir for her CAP diagnosis o/p after her hospitalization. Her symptoms fit, including diarrhea. 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  However patient does take Remicade for Crohn's disease and is supposed to have her next infusion tomorrow.  She is immunocompromised, which would  explain why she contracted it. Legionella is not sensitive to cefdinir but is sensitive to azithromycin which she received some of in the hospital which is likely what made her symptoms improve at that time. Today, her LFTs are wnl, no leukocytosis, and she is asymptomatic; she doesn't have any diarrhea at this time that would indicate ongoing severe disease. She has no leukocytosis and is afebrile as well. No hypoxia/tachypnea, no need to be admitted to the hospital at this time, and patient would like to be discharged. Patient does have this stable PNA on CXR so will treat with azithromycin IV 500 mg here and start a 7 day course at home, which covers legionella. Also found mild hypokalemia repleted. Patient instructed to follow-up with her PCP again in the next 1 to 2 weeks for follow-up and repeat chest x-ray in 2 to 4 weeks as originally planned at her discharge from the hospital.  Also follow-up with her PCP for lab recheck of her potassium.  Patient requests a dose of Diflucan in case she develops yeast infection symptoms with antibiotics which she often does.  I have prescribed it as needed if she develops symptoms.  Patient will be given DC instructions and return precautions, all questions answered to her satisfaction.   Clinical Course as of 01/01/23 1658  Thu Jan 01, 2023  1441 DG Chest 2 View FINDINGS: Persistent right basilar opacities, likely pneumonia. Aeration is mildly improved. Remainder lungs are clear. No visible pleural effusions or pneumothorax. Cardiomediastinal silhouette is within normal limits. No acute osseous abnormality.  IMPRESSION: Persistent right basilar opacities, likely pneumonia. Aeration is mildly improved.   [HN]  1611 WBC: 9.1 No leukocytosis  [HN]  1611 I-stat hCG, quantitative: <5.0 [HN]  1611 Lactic Acid, Venous: 1.3 [HN]  1611 Hemoglobin(!): 10.8 Mildly increased from prior [HN]  1611 Potassium(!): 3.0 Will replete orally. Patient states she has  loose stools at baseline from crohn's disease, could be from that. [HN]    Clinical Course User Index [HN] Loetta Rough, MD    Labs: I Ordered, and personally interpreted labs.  The pertinent results include:  those listed above  Imaging Studies ordered: I ordered imaging studies including CXR I independently visualized and interpreted imaging. I agree with the radiologist interpretation  Additional history obtained from chart review, family at bedside.    Cardiac Monitoring: The patient was maintained on a cardiac monitor.  I personally viewed and interpreted the cardiac monitored which showed an underlying rhythm of: NSR  Reevaluation: After the interventions noted above, I reevaluated the patient and found that they have :stayed the same  Social Determinants of Health: Patient lives independently   Disposition:  DC w/ discharge instructions/return precautions. All questions answered to patient's satisfaction.    Co morbidities that complicate the patient evaluation  Past Medical History:  Diagnosis Date   Coronary artery disease    ETT 10/21: Fair exercise capacity, achieved 8 METS, hypertensive blood pressure response, no ischemic EKG changes   GERD (gastroesophageal reflux disease)    Hyperlipidemia LDL goal <70    Hypertension    MI (myocardial infarction) [facility])    Nov 2010   Obesity      Medicines Meds ordered this encounter  Medications   azithromycin (ZITHROMAX) 500 mg in sodium chloride 0.9 % 250 mL IVPB    Order Specific Question:   Antibiotic Indication:    Answer:   CAP    Order Specific Question:   Other Indication:    Answer:   Legionella   potassium chloride SA (KLOR-CON M) CR tablet 20 mEq   azithromycin (ZITHROMAX) 250 MG tablet    Sig: Take 1 tablet (250 mg total) by mouth daily for 7 days.    Dispense:  7 tablet    Refill:  0   fluconazole (DIFLUCAN) 150 MG tablet    Sig: Take 1 tablet (150 mg total) by mouth daily for 1 dose. If you  develop symptoms of a yeast infection while taking antibiotics.    Dispense:  1 tablet    Refill:  0    I have reviewed the patients home medicines and have made adjustments as needed  Problem List / ED Course: Problem List Items Addressed This Visit   None Visit Diagnoses     Legionella pneumonia (HCC)    -  Primary   Relevant Medications   azithromycin (ZITHROMAX) 500 mg in sodium chloride 0.9 % 250 mL IVPB (Completed)   azithromycin (ZITHROMAX) 250 MG tablet (Start on 01/02/2023)   fluconazole (DIFLUCAN) 150 MG tablet                   This note was created using dictation software, which may contain spelling or grammatical errors.    Loetta Rough, MD 01/01/23 [phone]

## 2023-01-04 ENCOUNTER — Other Ambulatory Visit: Payer: Self-pay | Admitting: Cardiovascular Disease

## 2023-01-19 NOTE — Progress Notes (Unsigned)
Cardiology Clinic Note   Date: 01/21/2023 ID: [name] [id number], MRN [id number]  Primary Cardiologist:  Tonny Bollman, MD  Patient Profile    [name] is a 47 y.o. female who presents to the clinic today for routine follow up.     Past medical history significant for: CAD.  LHC 06/21/2008 (NSTEMI): Diffuse plaque throughout LAD 30% long segment stenosis proximally and 40 to 50% stenosis mid to distal.  D1 30-40 nonobstructive stenosis.  Mid OM1 90%.  Proximal LCx occluded at the AV groove.  Mid and distal LCx fills from a well-formed collateral of the distal RCA.  Distal AV groove circumflex fills from LAD collaterals off septal perforating vessels.  RCA severely diseased with segment of severe 80 to 90% stenosis in the mid portion.  Acute marginal with severe 90% ostial stenosis.  PCI with overlapping stents x 2 to OM1, stent 3.5 x 28 mm mid RCA. Exercise stress test 05/29/2020: Fair exercise capacity.  Hypertensive response to exercise, peak BP 192/96.  No stress EKG evidence of ischemia. Hypertension.  Hyperlipidemia.  Lipid panel 08/20/2021: LDL 83, HDL 39, TG 233, total 161. Tobacco abuse.  GERD.  Crohns.  Receives Remicade infusions.     History of Present Illness    [name] longtime patient of cardiology.  She is followed by Dr. Excell Seltzer for the above outlined history.  Patient was last evaluated by Eligha Bridegroom on 08/20/2021 and was doing well at that time. No changes were made.   Patient presented to the ED on 12/03/2022 with complaints of right rib pain and right-sided neck pain as well as a nonproductive cough, decreased appetite, fatigue, 1 episode of diarrhea, and fever.  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  She was admitted for sepsis secondary to right lower lobe pneumonia.  She was also found to be anemic with hemoglobin as low as 7.9.  She was treated with IV antibiotics and discharged on 12/07/2022.  She  returned to the ED on 01/01/2023 for recurrent pneumonia per PCP.  She reported she was notified by the health department that she was positive for Legionella.  Repeat x-ray by her PCP for routine follow-up showed persistent consolidation of right lower lobe.  She was treated with IV azithromycin and a course of PO azithromycin.  She was discharged from the ED on the same day with instructions to follow-up with PCP in 1 to 2 weeks to undergo repeat chest x-ray in 2 to 4 weeks.  Was found to be hypokalemic with potassium of 3.0 during her ED visit and repleted with p.o. potassium.  Today, patient is here alone. She is doing well. She feels she has recovered from recent PNA. Patient denies shortness of breath or dyspnea on exertion. No chest pain, pressure, or tightness. She has stairs in her home and is able to go up and down without difficulty. Denies lower extremity edema, orthopnea, or PND. No palpitations. 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 She continues to smoke a black and mild cigar 1-2 times a week. She has not been able to start Repatha secondary to a "glitch" with the coupon card. Spoke with Pharm D. Patient can go to the Repatha website and re-download the card for the pharmacy. She expressed understanding. Patient does not exercise.      ROS: All other systems reviewed and are otherwise negative except as noted in History  of Present Illness.  Studies Reviewed    ECG is not ordered today.       Physical Exam    VS:  BP 124/84   Pulse 84   Ht 5\' 2"  (1.575 m)   Wt 193 lb (87.5 kg)   SpO2 97%   BMI 35.30 kg/m  , BMI Body mass index is 35.3 kg/m.  GEN: Well nourished, well developed, in no acute distress. Neck: No JVD or carotid bruits. Cardiac:  RRR. No murmurs. No rubs or gallops.   Respiratory:  Respirations regular and unlabored. Clear to auscultation without rales, wheezing or rhonchi. GI: Soft, nontender,  nondistended. Extremities: Radials/DP/PT 2+ and equal bilaterally. No clubbing or cyanosis. No edema.  Skin: Warm and dry, no rash. Neuro: Strength intact.  Assessment & Plan    CAD.  S/p PCI with overlapping stents x 2 to OM1 and stent to mid RCA November 2009.  No evidence of ischemia on exercise stress test October 2021.  Patient denies chest pain, tightness, pressure. She does not exercise but is able to walk up and down stairs at home without difficulty.  Continue aspirin, metoprolol. She has not been able to start Repatha yet. Given instructions on how to get new coupon card so she can get started. She verbalized understanding. Encouraged patient to increase physical activity by starting a walking program and progressing as tolerated.  Hypertension. BP today 124/84. Patient denies headaches, dizziness or vision changes. Continue Zestoretic, metoprolol. Hyperlipidemia.  LDL January 2023 83, not at goal. Patient has not started Repatha yet (see #1).  Tobacco abuse. Continues to smoke 1-2 black and mild cigars a week. Discussed strategies for complete cessation.  Legionella pneumonia.  Patient diagnosed with pneumonia during hospital admission on 12/03/2022.  Repeat chest x-ray by PCP on 01/01/2023 showed persistent right lower lobe pneumonia.  She presented to the ED and was treated with IV azithromycin and was instructed to follow-up with PCP in 1 to 2 weeks and undergo repeat chest x-ray in 2 to 4 weeks.  Patient is feeling greatly improved. 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 She is going to speak to her PCP about getting a note ensuring she is given a bus with an [facility] when she starts back in August.   Disposition: Return in 1 year or sooner as needed.          Signed, Etta Grandchild. Niccolo Burggraf, DNP, NP-C

## 2023-01-21 ENCOUNTER — Encounter: Payer: Self-pay | Admitting: Student

## 2023-01-21 ENCOUNTER — Ambulatory Visit: Payer: BC Managed Care – PPO | Attending: Student | Admitting: Student

## 2023-01-21 VITALS — BP 124/84 | HR 84 | Ht 62.0 in | Wt 193.0 lb

## 2023-01-21 DIAGNOSIS — F17200 Nicotine dependence, unspecified, uncomplicated: Secondary | ICD-10-CM

## 2023-01-21 DIAGNOSIS — I10 Essential (primary) hypertension: Secondary | ICD-10-CM | POA: Diagnosis not present

## 2023-01-21 DIAGNOSIS — E785 Hyperlipidemia, unspecified: Secondary | ICD-10-CM | POA: Diagnosis not present

## 2023-01-21 DIAGNOSIS — A481 Legionnaires' disease: Secondary | ICD-10-CM

## 2023-01-21 DIAGNOSIS — I2510 Atherosclerotic heart disease of native coronary artery without angina pectoris: Secondary | ICD-10-CM | POA: Diagnosis not present

## 2023-01-21 NOTE — Patient Instructions (Signed)
Medication Instructions:  Your physician recommends that you continue on your current medications as directed. Please refer to the Current Medication list given to you today.  *If you need a refill on your cardiac medications before your next appointment, please call your pharmacy*   Lab Work: None   Testing/Procedures: None   Follow-Up: At [facility], you and your health needs are our priority.  As part of our continuing mission to provide you with exceptional heart care, we have created designated Provider Care Teams.  These Care Teams include your primary Cardiologist (physician) and Advanced Practice Providers (APPs -  Physician Assistants and Nurse Practitioners) who all work together to provide you with the care you need, when you need it.    Your next appointment:   1 year(s)  Provider:   Tonny Bollman, MD or APP  Other instructions:  Go to [email or web address] to obtain another coupon.

## 2023-01-27 ENCOUNTER — Other Ambulatory Visit: Payer: Self-pay | Admitting: Cardiovascular Disease

## 2023-03-23 ENCOUNTER — Other Ambulatory Visit: Payer: Self-pay | Admitting: Cardiovascular Disease

## 2023-09-28 ENCOUNTER — Ambulatory Visit: Payer: BC Managed Care – PPO | Admitting: Podiatry

## 2023-10-07 ENCOUNTER — Emergency Department (HOSPITAL_COMMUNITY): Admission: EM | Admit: 2023-10-07 | Discharge: 2023-10-07 | Discharge status: Against Medical Advice | Payer: Self-pay

## 2023-10-12 ENCOUNTER — Ambulatory Visit: Payer: BC Managed Care – PPO | Admitting: Podiatry

## 2023-10-21 ENCOUNTER — Other Ambulatory Visit: Payer: Self-pay | Admitting: Cardiovascular Disease

## 2023-11-04 ENCOUNTER — Other Ambulatory Visit (HOSPITAL_COMMUNITY): Payer: Self-pay

## 2024-01-14 ENCOUNTER — Other Ambulatory Visit: Payer: Self-pay | Admitting: Cardiovascular Disease

## 2024-02-19 ENCOUNTER — Encounter: Payer: Self-pay | Admitting: Advanced Practice Midwife

## 2024-04-12 ENCOUNTER — Other Ambulatory Visit: Payer: Self-pay

## 2024-04-13 NOTE — Progress Notes (Deleted)
 Cardiology Office Note    Patient Name: [name] Date of Encounter: 04/13/2024  Primary Care Provider:  Samie Frederick, PA-C Primary Cardiologist:  Ozell Fell, MD Primary Electrophysiologist: None   Past Medical History    Past Medical History:  Diagnosis Date   Coronary artery disease    ETT 10/21: Fair exercise capacity, achieved 8 METS, hypertensive blood pressure response, no ischemic EKG changes   GERD (gastroesophageal reflux disease)    Hyperlipidemia LDL goal <70    Hypertension    MI (myocardial infarction) [facility])    Nov 2010   Obesity     History of Present Illness  [name] is a 48 y.o. female with a PMH of CAD s/p NSTEMI 06/2008 with DES to the RCA and OM1, HTN, Crohn's disease, HLD, GERD DM type II who presents today for 1 year follow-up.  [name] has a past medical history of CAD with NSTEMI suffered in 06/2008 and treated with DES to RCA and OM1.  Her last ischemic evaluation by ETT in 221 that was normal but showed hypertensive response to exercise.  She was admitted in 01/01/2023 with recurrent pneumonia and found to have Legionella and sepsis pneumonia.  She was last seen on 01/21/2023 for follow-up and denied any chest pain or shortness of breath.  Visit her blood pressure was controlled at 124/84 and patient was advised to start Repatha  and was seeking assistance from Pharm.D.  Patient denies chest pain, palpitations, dyspnea, PND, orthopnea, nausea, vomiting, dizziness, syncope, edema, weight gain, or early satiety.   Discussed the use of AI scribe software for clinical note transcription with the patient, who gave verbal consent to proceed.  History of Present Illness    ***Notes:   Review of Systems  Please see the history of present illness.    All other systems reviewed and are otherwise negative except as noted above.  Physical Exam    Wt Readings from Last 3 Encounters:  01/21/23 193 lb (87.5 kg)  12/03/22 191 lb  5.8 oz (86.8 kg)  08/20/21 191 lb (86.6 kg)   CD:Uyzmz were no vitals filed for this visit.,There is no height or weight on file to calculate BMI. GEN: Well nourished, well developed in no acute distress Neck: No JVD; No carotid bruits Pulmonary: Clear to auscultation without rales, wheezing or rhonchi  Cardiovascular: Normal rate. Regular rhythm. Normal S1. Normal S2.   Murmurs: There is no murmur.  ABDOMEN: Soft, non-tender, non-distended EXTREMITIES:  No edema; No deformity   EKG/LABS/ Recent Cardiac Studies   ECG personally reviewed by me today - ***  Risk Assessment/Calculations:   {Does this patient have ATRIAL FIBRILLATION?[phone]}      Lab Results  Component Value Date   WBC 9.1 01/01/2023   HGB 10.8 (L) 01/01/2023   HCT 33.1 (L) 01/01/2023   MCV 98.2 01/01/2023   PLT 329 01/01/2023   Lab Results  Component Value Date   CREATININE 0.87 01/01/2023   BUN 12 01/01/2023   NA 138 01/01/2023   K 3.0 (L) 01/01/2023   CL 102 01/01/2023   CO2 27 01/01/2023   Lab Results  Component Value Date   CHOL 161 08/20/2021   HDL 39 (L) 08/20/2021   LDLCALC 83 08/20/2021   TRIG 233 (H) 08/20/2021   CHOLHDL 4.1 08/20/2021    Lab Results  Component Value Date   HGBA1C 7.6 (H) 12/03/2022   Assessment & Plan    Assessment and Plan Assessment & Plan  1.  History of CAD  2.  Essential hypertension  3.  Hyperlipidemia  4.  DM type II      Disposition: Follow-up with Ozell Fell, MD or APP in *** months {Are you ordering a CV Procedure (e.g. stress test, cath, DCCV, TEE, etc)?   Press F2        :[id number]}   Signed, Wyn Raddle, Jackee Shove, NP 04/13/2024, 7:07 AM [facility]

## 2024-04-14 ENCOUNTER — Ambulatory Visit: Admitting: Nurse Practitioner

## 2024-04-14 DIAGNOSIS — E1165 Type 2 diabetes mellitus with hyperglycemia: Secondary | ICD-10-CM

## 2024-04-14 DIAGNOSIS — I10 Essential (primary) hypertension: Secondary | ICD-10-CM

## 2024-04-14 DIAGNOSIS — E785 Hyperlipidemia, unspecified: Secondary | ICD-10-CM

## 2024-04-14 DIAGNOSIS — I2510 Atherosclerotic heart disease of native coronary artery without angina pectoris: Secondary | ICD-10-CM

## 2024-04-14 MED ORDER — METOPROLOL TARTRATE 50 MG PO TABS: 50.0000 mg | ORAL_TABLET | Freq: Two times a day (BID) | ORAL | 0 refills | Status: DC

## 2024-04-21 NOTE — Progress Notes (Deleted)
  Cardiology Office Note:  .   Date:  04/21/2024  ID:  Arty DELENA Mau, DOB 1976-06-21, MRN [id number] PCP: Samie Frederick, PA-C  [facility] Providers Cardiologist:  Ozell Fell, MD { Click to update primary MD,subspecialty MD or APP then REFRESH:1}   History of Present Illness: .   Shamieka A Budde is a 48 y.o. female  with a PMH of CAD s/p NSTEMI 06/2008 with DES to the RCA and OM1, HTN, Crohn's disease, HLD, GERD DM type II who presents today for 1 year follow-up.  Ms. Foxworth has a past medical history of CAD with NSTEMI suffered in 06/2008 and treated with DES to RCA and OM1.  Her last ischemic evaluation by ETT in 221 that was normal but showed hypertensive response to exercise.  She was admitted in 01/01/2023 with recurrent pneumonia and found to have Legionella and sepsis pneumonia.  She was last seen on 01/21/2023 for follow-up and denied any chest pain or shortness of breath.  Visit her blood pressure was controlled at 124/84 and patient was advised to start Repatha  and was seeking assistance from Pharm.D.   ROS: ***  Studies Reviewed: SABRA         Prior CV Studies: {Select studies to display:26339}  ***  Risk Assessment/Calculations:   {Does this patient have ATRIAL FIBRILLATION?:[phone]} No BP recorded.  {Refresh Note OR Click here to enter BP  :1}***       Physical Exam:   VS:  There were no vitals taken for this visit.   Orhtostatics: No data found. Wt Readings from Last 3 Encounters:  01/21/23 193 lb (87.5 kg)  12/03/22 191 lb 5.8 oz (86.8 kg)  08/20/21 191 lb (86.6 kg)    GEN: Well nourished, well developed in no acute distress NECK: No JVD; No carotid bruits CARDIAC: ***RRR, no murmurs, rubs, gallops RESPIRATORY:  Clear to auscultation without rales, wheezing or rhonchi  ABDOMEN: Soft, non-tender, non-distended EXTREMITIES:  No edema; No deformity   ASSESSMENT AND PLAN: .    History of CAD  2.  Essential hypertension  3.   Hyperlipidemia  4.  DM type II     {Are you ordering a CV Procedure (e.g. stress test, cath, DCCV, TEE, etc)?   Press F2        :[id number]}  Dispo: ***  Signed, Olivia Pavy, PA-C

## 2024-05-01 NOTE — Progress Notes (Unsigned)
 Cardiology Office Note:  .   Date:  05/02/2024  ID:  [name], DOB 1975/10/11, MRN [id number] PCP: Samie Frederick, PA-C  [facility] Providers Cardiologist:  Ozell Fell, MD    History of Present Illness: .   [name] is a 48 y.o. female  with a PMH of CAD s/p NSTEMI 06/2008 with DES to the RCA and OM1, HTN, Crohn's disease, HLD, GERD DM type II who presents today for 1 year follow-up.  [name] has a past medical history of CAD with NSTEMI suffered in 06/2008 and treated with DES to RCA and OM1.  Her last ischemic evaluation by ETT in 2021 that was normal but showed hypertensive response to exercise.  She was admitted in 01/01/2023 with recurrent pneumonia and found to have Legionella and sepsis pneumonia.  She was last seen on 01/21/2023 for follow-up and denied any chest pain or shortness of breath.  Visit her blood pressure was controlled at 124/84 and patient was advised to start Repatha  and was seeking assistance from Pharm.D.  Patient here for f/u. Works as Surveyor, mining. Never started repatha  and lipids in care everywhere from 02/2024 reviewed and elevated. Denies chest pain, dyspnea, palpitations, edema, dizziness. No regular exercise. Smoking 2 cigarettes daily and trying to quit.   ROS:    Studies Reviewed: SABRA    EKG Interpretation Date/Time:  Monday May 02 2024 09:54:37 EDT Ventricular Rate:  88 PR Interval:  146 QRS Duration:  74 QT Interval:  356 QTC Calculation: 430 R Axis:   17  Text Interpretation: Normal sinus rhythm Normal ECG When compared with ECG of 03-Dec-2022 04:22, PREVIOUS ECG IS PRESENT Confirmed by Parthenia Klinefelter [phone]) on 05/02/2024 9:56:47 AM    Prior CV Studies:   GXT 2021 Stress FindingsThe patient exercised following the Bruce protocol.   The patient reported no symptoms during the stress test. The patient experienced no angina during the stress test.   The patient requested the test to be stopped.    Blood pressure and heart rate demonstrated a normal response to exercise. Blood pressure demonstrated a normal response to exercise. Overall, the patient's exercise capacity was normal.   85% of maximum heart rate was achieved after 5.4 minutes.  Recovery time:  5 minutes.  The patient's response to exercise was adequate for diagnosis.  Risk Assessment/Calculations:     HYPERTENSION CONTROL Vitals:   05/02/24 0959 05/02/24 1014  BP: (!) 134/94 (!) 150/90    The patient's blood pressure is elevated above target today.  In order to address the patient's elevated BP: Blood pressure will be monitored at home to determine if medication changes need to be made.; Follow up with general cardiology has been recommended.          Physical Exam:   VS:  BP (!) 150/90   Pulse 88   Ht 5' 2 (1.575 m)   Wt 203 lb (92.1 kg)   SpO2 97%   BMI 37.13 kg/m    Orhtostatics: No data found. Wt Readings from Last 3 Encounters:  05/02/24 203 lb (92.1 kg)  01/21/23 193 lb (87.5 kg)  12/03/22 191 lb 5.8 oz (86.8 kg)    GEN: Obese, in no acute distress NECK: No JVD; No carotid bruits CARDIAC:  RRR, no murmurs, rubs, gallops RESPIRATORY: inspiratory wheezing  ABDOMEN: Soft, non-tender, non-distended EXTREMITIES:  No edema; No deformity   ASSESSMENT AND PLAN: .    History of CAD-s/p NSTEMI 06/2008 with DES to the  RCA and OM1, GXT 2021 negative/HTN response  -no angina -continue lipitor/ASA/metoprolol /lisinopril /hydrochlorothiazide /Kdur -lifestyle changes discussed- 30 min exercise daily, smoking cessation, HTN & DM2 control.    Essential hypertension BP up today, didn't take meds yet and on the phone with her boss -check BP daily for 2 weeks and send through mychart  Hyperlipidemia 02/2024 care everywhere: Chol 250, trig 421, LDL 138-never started repatha  but now willing to -refer back to lipid clinic, she's willing to start.  DM type II-per primary team A1C 8.6  Tobacco abuse-smoking  cessation discussed  Anemia-followed by heme        Dispo: f/u in 1 yr pending BP  Signed, Olivia Pavy, PA-C

## 2024-05-02 ENCOUNTER — Ambulatory Visit: Attending: Physician Assistant | Admitting: Physician Assistant

## 2024-05-02 ENCOUNTER — Ambulatory Visit: Admitting: Physician Assistant

## 2024-05-02 ENCOUNTER — Other Ambulatory Visit (HOSPITAL_COMMUNITY): Payer: Self-pay

## 2024-05-02 ENCOUNTER — Encounter: Payer: Self-pay | Admitting: Physician Assistant

## 2024-05-02 ENCOUNTER — Telehealth: Payer: Self-pay | Admitting: Pharmacy Technician

## 2024-05-02 VITALS — BP 150/90 | HR 88 | Ht 62.0 in | Wt 203.0 lb

## 2024-05-02 DIAGNOSIS — I10 Essential (primary) hypertension: Secondary | ICD-10-CM | POA: Insufficient documentation

## 2024-05-02 DIAGNOSIS — I2510 Atherosclerotic heart disease of native coronary artery without angina pectoris: Secondary | ICD-10-CM | POA: Diagnosis not present

## 2024-05-02 DIAGNOSIS — E785 Hyperlipidemia, unspecified: Secondary | ICD-10-CM | POA: Insufficient documentation

## 2024-05-02 DIAGNOSIS — E1165 Type 2 diabetes mellitus with hyperglycemia: Secondary | ICD-10-CM | POA: Diagnosis not present

## 2024-05-02 DIAGNOSIS — Z720 Tobacco use: Secondary | ICD-10-CM | POA: Diagnosis present

## 2024-05-02 NOTE — Patient Instructions (Signed)
 Medication Instructions:  Our pharmacist will be in touch with you regarding your Repatha .  *If you need a refill on your cardiac medications before your next appointment, please call your pharmacy*  Lab Work: None ordered If you have labs (blood work) drawn today and your tests are completely normal, you will receive your results only by: MyChart Message (if you have MyChart) OR A paper copy in the mail If you have any lab test that is abnormal or we need to change your treatment, we will call you to review the results.  Follow-Up: At [facility], you and your health needs are our priority.  As part of our continuing mission to provide you with exceptional heart care, our providers are all part of one team.  This team includes your primary Cardiologist (physician) and Advanced Practice Providers or APPs (Physician Assistants and Nurse Practitioners) who all work together to provide you with the care you need, when you need it.  Your next appointment:   1 year(s)  Provider:   Ozell Fell, MD    Other Instructions Check your blood pressure daily, 2 hrs after taking your medications for 2 weeks, keep a log and send us  the readings through mychart or call us  at the end of the 2 weeks.   Your provider recommends that you maintain 150 minutes per week of moderate aerobic activity.

## 2024-05-02 NOTE — Telephone Encounter (Addendum)
 Pharmacy Patient Advocate Encounter   Received notification from Pt Calls Messages that prior authorization for Repatha  is required/requested.   Insurance verification completed.  [facility] medicaid The patient is insured through  .   Per test claim: PA required; PA submitted to above mentioned insurance via Latent Key/confirmation #/EOC [id number] Status is pending

## 2024-05-03 ENCOUNTER — Other Ambulatory Visit (HOSPITAL_COMMUNITY): Payer: Self-pay

## 2024-05-03 MED ORDER — REPATHA SURECLICK 140 MG/ML ~~LOC~~ SOAJ: 1.0000 mL | SUBCUTANEOUS | 3 refills | Status: AC

## 2024-05-03 NOTE — Addendum Note (Signed)
 Addended by: Syleena Mchan D on: 05/03/2024 01:29 PM   Modules accepted: Orders

## 2024-05-03 NOTE — Telephone Encounter (Signed)
 Pt made aware of approval. She is comfortable looking up how to inject online. Rx sent for 90 DS. $4 is affordable

## 2024-05-03 NOTE — Telephone Encounter (Signed)
 Pharmacy Patient Advocate Encounter  Received notification from [facility] MEDICAID that Prior Authorization for repatha  has been APPROVED from 05/03/24 to 05/03/25. Ran test claim, Copay is $4.00- one month. This test claim was processed through [facility]- copay amounts may vary at other pharmacies due to pharmacy/plan contracts, or as the patient moves through the different stages of their insurance plan.   PA #/Case ID/Reference #: [id number]

## 2024-05-13 ENCOUNTER — Other Ambulatory Visit: Payer: Self-pay | Admitting: Student

## 2024-05-17 ENCOUNTER — Other Ambulatory Visit: Payer: Self-pay | Admitting: Medical Genetics

## 2024-05-19 ENCOUNTER — Telehealth: Payer: Self-pay | Admitting: Cardiovascular Disease

## 2024-05-19 DIAGNOSIS — I2510 Atherosclerotic heart disease of native coronary artery without angina pectoris: Secondary | ICD-10-CM

## 2024-05-19 NOTE — Telephone Encounter (Signed)
 Pt requesting to have a stress test done for work purposes.

## 2024-05-19 NOTE — Telephone Encounter (Signed)
 Call to patient to discuss concerns about  needing a stress test. 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 She states she had an office visit with EMERSON Pavy on 05/02/24 but she is now being told she needs a stress test by employee health. She does not have any paperwork or written instructions but states she will check with her workplace about any forms that need to be signed. Message forwarded to Dr. Caryl Pavy for recommendations.

## 2024-05-23 NOTE — Telephone Encounter (Signed)
 Called patient to see if she was able to walk on a treadmill and she said she was. I have put in the GXT order and has reached out to a scheduler to have her scheduled to have that GXT done. Someone from the office will be reaching out to her with and appointment.

## 2024-05-23 NOTE — Addendum Note (Signed)
 Addended by: BILLY CAMELIA CROME on: 05/23/2024 08:46 AM   Modules accepted: Orders

## 2024-05-24 ENCOUNTER — Ambulatory Visit: Admitting: Cardiology

## 2024-05-25 ENCOUNTER — Encounter (HOSPITAL_COMMUNITY): Payer: Self-pay | Admitting: *Deleted

## 2024-05-27 NOTE — Progress Notes (Signed)
     Patient: [name]  CHART#: [id number] Date: 05/27/2024  Ordering Doctor: Lonni IVAR Bowers, MD Supervising Doctor:  Mabel Endo, MD  Age: 48 y.o.                DOB: 24-Jan-1976  Gender: female Drug Allergies: Allergies[1]  Admission Vitals:   Vitals:   05/27/24 0923  BP: 137/86  BP Location: Right Upper Arm  Patient Position: Sitting  Pulse: 94  Temp: 98.2 F (36.8 C)  TempSrc: Oral  Weight: 205 lb (93 kg)  Height: 5' 2 (1.575 m)     1. Crohn's disease without complication, unspecified gastrointestinal tract location (*)  inFLIXimab injection    (10 mg/kg)    Interval: every 6 weeks                            Pre med: dhspremed: Tylenol  1000mg  po and Zyrtec 10mg  po  Name of Medication:  Administrations This Visit     Remicade/inFLIXimab injection     Admin Date 05/27/2024 Action Given Dose 1,000 mg Route IntraVENous Documented By Burnard Marion, LPN               # Vials: 10               Verified by: Burnard Pattee, RN  Lot: [id number]                Exp Date: 11/2026 NDC# 42105-969-98  Normal Saline: 500ml IV Start time: 9071 Needle size and site:  dhsneedlesizeandgauge: 22 gauge left AC INFUSION: Time Started: 0936 Time Ended:  1123  VITAL SIGNS:  Time:      0951  1021  1051 Temperature:   98.6  98.1  98.4 Pulse:     86  86  71 B.P:     133/84  134/85  137/87  Infusion Rate:  16ml/hr 212ml/hr 219ml/hr IV Site:    Okay  Okay  Okay  Complications:                   none                           Site complications:Unchanged Treatment: Soaks:  no  Nurse notes: Patient tolerated infusion well.  Nurse Name:  Burnard Marion, LPN          [8] Allergies Allergen Reactions  . Vicodin [Hydrocodone-Acetaminophen ] Itching  . Ezetimibe  Swelling    Swollen face, dark urine, fatigue  . Rosuvastatin  Hives    Itching and hives/rash with 40mg  dosing  . Simvastatin Other    Myalgias with 20mg  and 40mg

## 2024-06-03 ENCOUNTER — Ambulatory Visit (HOSPITAL_COMMUNITY)

## 2024-06-09 ENCOUNTER — Ambulatory Visit (HOSPITAL_COMMUNITY)
Admission: RE | Admit: 2024-06-09 | Discharge: 2024-06-09 | Disposition: A | Discharge status: Home / Self Care | Source: Ambulatory Visit | Attending: Cardiovascular Disease | Admitting: Cardiovascular Disease

## 2024-06-09 DIAGNOSIS — I2510 Atherosclerotic heart disease of native coronary artery without angina pectoris: Secondary | ICD-10-CM | POA: Diagnosis present

## 2024-06-09 LAB — EXERCISE TOLERANCE TEST
Angina Index: 0
Estimated workload: 7.8
Exercise duration (min): 6 min
Exercise duration (sec): 34 s
MPHR: 171 {beats}/min
Peak HR: 169 {beats}/min
Percent HR: 98 %
Rest HR: 105 {beats}/min

## 2024-06-10 ENCOUNTER — Ambulatory Visit: Payer: Self-pay | Admitting: Physician Assistant

## 2024-06-13 NOTE — Progress Notes (Signed)
 Spoke to patient and gave her the results to the exercise stress test that was done. I will send her the results via MyChart as well as mail them to patient's home address today so patient can take to her employer. She wanted to know which medication she is in need of in order to follow up with her PCP. She understood everything and had no questions at this time.

## 2024-08-17 ENCOUNTER — Other Ambulatory Visit: Payer: Self-pay | Admitting: Medical Genetics

## 2024-08-17 DIAGNOSIS — Z006 Encounter for examination for normal comparison and control in clinical research program: Secondary | ICD-10-CM

## 2024-08-17 DIAGNOSIS — Z0279 Encounter for issue of other medical certificate: Secondary | ICD-10-CM

## 2024-08-19 ENCOUNTER — Telehealth: Payer: Self-pay | Admitting: Cardiovascular Disease

## 2024-08-19 NOTE — Telephone Encounter (Signed)
 Spoke with patient regarding Exercise Tolerance Test. Pt asking if she can have a copy sent to her. Printed and copy being mailed. Closing encounter. Pt verbalized understanding of plan.

## 2024-08-19 NOTE — Telephone Encounter (Signed)
 Patient brought in a Fitness for Duty form from [facility]. Patient signed the Release of Information and paid $29 fee.  Form in Dr. Margurite box.

## 2024-08-19 NOTE — Telephone Encounter (Signed)
 Patient would like a couple of her stress test from 06/09/24. Please advise

## 2024-09-01 NOTE — Telephone Encounter (Signed)
 Completed forms placed in FMLA mailbox on 5th floor.

## 2024-09-02 NOTE — Telephone Encounter (Signed)
 Completed Fitness for Duty form faxed to [facility] and scanned into chart. Billing notified.
